# Patient Record
Sex: Male | Born: 1985 | Race: Black or African American | Hispanic: No | Marital: Single | Smoking: Current every day smoker
Health system: Southern US, Community
[De-identification: ages and names within clinical notes are randomized; demographics above are authoritative.]

## PROBLEM LIST (undated history)

## (undated) DIAGNOSIS — L0390 Cellulitis, unspecified: Secondary | ICD-10-CM

## (undated) DIAGNOSIS — Q820 Hereditary lymphedema: Secondary | ICD-10-CM

## (undated) DIAGNOSIS — I890 Lymphedema, not elsewhere classified: Secondary | ICD-10-CM

---

## 2020-05-29 ENCOUNTER — Encounter (HOSPITAL_BASED_OUTPATIENT_CLINIC_OR_DEPARTMENT_OTHER): Payer: Self-pay

## 2020-05-29 ENCOUNTER — Other Ambulatory Visit (HOSPITAL_BASED_OUTPATIENT_CLINIC_OR_DEPARTMENT_OTHER): Payer: Self-pay

## 2020-05-29 ENCOUNTER — Emergency Department (HOSPITAL_BASED_OUTPATIENT_CLINIC_OR_DEPARTMENT_OTHER)
Admission: EM | Admit: 2020-05-29 | Discharge: 2020-05-29 | Disposition: A | Discharge status: Home / Self Care | Payer: Self-pay | Attending: Emergency Medicine | Admitting: Emergency Medicine

## 2020-05-29 ENCOUNTER — Other Ambulatory Visit: Payer: Self-pay

## 2020-05-29 ENCOUNTER — Emergency Department (HOSPITAL_BASED_OUTPATIENT_CLINIC_OR_DEPARTMENT_OTHER): Payer: Self-pay

## 2020-05-29 DIAGNOSIS — R197 Diarrhea, unspecified: Secondary | ICD-10-CM

## 2020-05-29 DIAGNOSIS — I890 Lymphedema, not elsewhere classified: Secondary | ICD-10-CM

## 2020-05-29 DIAGNOSIS — F17210 Nicotine dependence, cigarettes, uncomplicated: Secondary | ICD-10-CM | POA: Insufficient documentation

## 2020-05-29 DIAGNOSIS — L03116 Cellulitis of left lower limb: Secondary | ICD-10-CM

## 2020-05-29 HISTORY — DX: Cellulitis, unspecified: L03.90

## 2020-05-29 MED ORDER — CEPHALEXIN 500 MG PO CAPS: 500.0000 mg | ORAL_CAPSULE | Freq: Four times a day (QID) | ORAL | 0 refills | Status: DC

## 2020-05-29 MED ORDER — LOPERAMIDE HCL 2 MG PO TABS
2.0000 mg | ORAL_TABLET | Freq: Four times a day (QID) | ORAL | 0 refills | Status: DC | PRN
  Filled 2020-05-29: qty 24, 6d supply, fill #0

## 2020-05-29 NOTE — Discharge Instructions (Signed)
Wear the compression stockings.  Watch for increasing signs of infection.  Try and keep yourself hydrated.  Follow-up with a primary care doctor for chronic management of the edema.

## 2020-05-29 NOTE — ED Triage Notes (Signed)
Pt presents with 3 day history of left lower leg swelling.  He states it does not really hurt.  He has tried elevating the leg without any relief.  He has not taken any medications for this issue.  He states he has also had diarrhea and estimates approximately 4 instances of diarrhea since yesterday.

## 2020-05-29 NOTE — ED Notes (Signed)
ED Provider at bedside. 

## 2020-05-29 NOTE — ED Provider Notes (Signed)
[facility] EMERGENCY DEPARTMENT Provider Note   CSN: [id number] Arrival date & time: 05/29/20  2440     History Chief Complaint  Patient presents with  . Leg Swelling    Omar Hayden is a 35 y.o. male.  HPI Patient presents with leg swelling.  Has acute on chronic lymphedema.  States his family has had and he has it too.  Usually wears pressure dressings.  States he thinks it is more swollen because he did a heavy amount of drinking over the last weekend.  No fevers but states he has had some chills and some sweats last night.  Also developed some diarrhea.  There is more swelling of the left lower extremity than baseline.  There is also erythema.  No drainage.    Past Medical History:  Diagnosis Date  . Cellulitis     There are no problems to display for this patient.   History reviewed. No pertinent surgical history.     No family history on file.  Social History   Tobacco Use  . Smoking status: Current Every Day Smoker    Packs/day: 1.00    Types: Cigarettes  . Smokeless tobacco: Never Used  Vaping Use  . Vaping Use: Never used  Substance Use Topics  . Alcohol use: Yes    Comment: states drinks alcohol very rarely  . Drug use: Never    Home Medications Prior to Admission medications   Medication Sig Start Date End Date Taking? Authorizing Provider  cephALEXin (KEFLEX) 500 MG capsule Take 1 capsule (500 mg total) by mouth 4 (four) times daily. 05/29/20  Yes Benjiman Core, MD  loperamide (IMODIUM) 2 MG capsule Take 1 capsule (2 mg total) by mouth 4 (four) times daily as needed for diarrhea or loose stools. 05/29/20  Yes Benjiman Core, MD    Allergies    Patient has no known allergies.  Review of Systems   Review of Systems  Constitutional: Positive for chills and diaphoresis. Negative for appetite change and fatigue.  HENT: Negative for congestion.   Cardiovascular: Positive for leg swelling.  Gastrointestinal: Positive for diarrhea.  Negative for abdominal pain.  Genitourinary: Negative for enuresis.  Musculoskeletal: Negative for back pain.  Skin: Positive for color change.  Neurological: Negative for weakness.  Psychiatric/Behavioral: Negative for confusion.    Physical Exam Updated Vital Signs BP 128/81 (BP Location: Right Arm)   Pulse 76   Temp 98 F (36.7 C) (Oral)   Resp 16   Ht 6\' 2"  (1.88 m)   Wt 111.1 kg   SpO2 99%   BMI 31.46 kg/m   Physical Exam Vitals and nursing note reviewed.  Constitutional:      Appearance: He is not ill-appearing.  HENT:     Head: Atraumatic.  Eyes:     Pupils: Pupils are equal, round, and reactive to light.  Cardiovascular:     Rate and Rhythm: Regular rhythm.  Pulmonary:     Breath sounds: No wheezing.  Abdominal:     Tenderness: There is no abdominal tenderness.  Musculoskeletal:     Cervical back: Neck supple.     Comments: Lymphedema bilateral lower extremities.  Goes to release knees.  Worse on left compared to right.  There is edema down to the foot on the left more than the right.  There is erythema of the lower left side.  No fluctuance.  Some induration particularly posterior.  Skin:    General: Skin is warm.     Capillary  Refill: Capillary refill takes less than 2 seconds.  Neurological:     Mental Status: He is alert and oriented to person, place, and time.  Psychiatric:        Mood and Affect: Mood normal.     ED Results / Procedures / Treatments   Labs (all labs ordered are listed, but only abnormal results are displayed) Labs Reviewed - No data to display  EKG None  Radiology US Venous Img Lower Unilateral Left  Result Date: 05/29/2020 CLINICAL DATA:  Left leg swelling. EXAM: LEFT LOWER EXTREMITY VENOUS DOPPLER ULTRASOUND TECHNIQUE: Gray-scale sonography with compression, as well as color and duplex ultrasound, were performed to evaluate the deep venous system(s) from the level of the common femoral vein through the popliteal and proximal  calf veins. COMPARISON:  None. FINDINGS: VENOUS Normal compressibility of the common femoral, superficial femoral, and popliteal veins, as well as the visualized calf veins. Visualized portions of profunda femoral vein and great saphenous vein unremarkable. No filling defects to suggest DVT on grayscale or color Doppler imaging. Doppler waveforms show normal direction of venous flow, normal respiratory plasticity and response to augmentation. Limited views of the contralateral common femoral vein are unremarkable. IMPRESSION: No evidence of DVT. Electronically Signed   By: Feliberto Harts MD   On: 05/29/2020 09:52    Procedures Procedures   Medications Ordered in ED Medications - No data to display  ED Course  I have reviewed the triage vital signs and the nursing notes.  Pertinent labs & imaging results that were available during my care of the patient were reviewed by me and considered in my medical decision making (see chart for details).    MDM Rules/Calculators/A&P                          Patient with lymphedema of both lower extremities worse on left.  Normally is worse on left but not usually not this swollen.  Doppler done and negative.  Reviewed lab work from 2 days ago at [facility].  Reassuring.  However with increasing redness and swelling we will treat for cellulitis.  Doubt abscess.  Will give antibiotics.  Outpatient follow-up as needed.  Also has had diarrhea.  Will give symptomatic treatment.  Otherwise well-appearing. Final Clinical Impression(s) / ED Diagnoses Final diagnoses:  Cellulitis of left lower extremity  Diarrhea, unspecified type  Lymphedema    Rx / DC Orders ED Discharge Orders         Ordered    cephALEXin (KEFLEX) 500 MG capsule  4 times daily        05/29/20 1015    loperamide (IMODIUM) 2 MG capsule  4 times daily PRN        05/29/20 1015           Benjiman Core, MD 05/29/20 1022

## 2020-06-05 ENCOUNTER — Other Ambulatory Visit (HOSPITAL_BASED_OUTPATIENT_CLINIC_OR_DEPARTMENT_OTHER): Payer: Self-pay

## 2020-11-04 ENCOUNTER — Other Ambulatory Visit: Payer: Self-pay

## 2020-11-04 ENCOUNTER — Encounter (HOSPITAL_BASED_OUTPATIENT_CLINIC_OR_DEPARTMENT_OTHER): Payer: Self-pay | Admitting: Emergency Medicine

## 2020-11-04 ENCOUNTER — Emergency Department (HOSPITAL_BASED_OUTPATIENT_CLINIC_OR_DEPARTMENT_OTHER)
Admission: EM | Admit: 2020-11-04 | Discharge: 2020-11-05 | Disposition: A | Discharge status: Home / Self Care | Payer: Self-pay | Attending: Emergency Medicine | Admitting: Emergency Medicine

## 2020-11-04 DIAGNOSIS — F17210 Nicotine dependence, cigarettes, uncomplicated: Secondary | ICD-10-CM | POA: Insufficient documentation

## 2020-11-04 DIAGNOSIS — L03116 Cellulitis of left lower limb: Secondary | ICD-10-CM | POA: Insufficient documentation

## 2020-11-04 HISTORY — DX: Hereditary lymphedema: Q82.0

## 2020-11-04 NOTE — ED Triage Notes (Signed)
Pt reports left leg pain since Friday with swelling and redness

## 2020-11-04 NOTE — ED Provider Notes (Signed)
[facility] Provider Note: Omar Dell, MD, FACEP  CSN: [id number] MRN: [id number] ARRIVAL: 11/04/20 at 2051 ROOM: MH01/[facility]01   CHIEF COMPLAINT  Leg Pain   HISTORY OF PRESENT ILLNESS  11/04/20 11:58 PM Omar Hayden is a 35 y.o. 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  He is here with about 3 days of pain and erythema to his posterior left thigh.  He rates his pain as an 8 out of 10, throbbing in nature.  It is worse with attempted ambulation.  He has had a fever with this although he does not know how high.  He denies pain in the lymphedematous parts of his legs.   Past Medical History:  Diagnosis Date   Cellulitis    Hereditary lymphedema of legs     History reviewed. No pertinent surgical history.  History reviewed. No pertinent family history.  Social History   Tobacco Use   Smoking status: Every Day    Packs/day: 1.00    Types: Cigarettes   Smokeless tobacco: Never  Vaping Use   Vaping Use: Never used  Substance Use Topics   Alcohol use: Yes    Comment: states drinks alcohol very rarely   Drug use: Never    Prior to Admission medications   Medication Sig Start Date End Date Taking? Authorizing Provider  cephALEXin (KEFLEX) 500 MG capsule Take 1 capsule (500 mg total) by mouth 4 (four) times daily. 11/05/20   Cheick Suhr, Jonny Ruiz, MD    Allergies Patient has no known allergies.   REVIEW OF SYSTEMS  Negative except as noted here or in the History of Present Illness.   PHYSICAL EXAMINATION  Initial Vital Signs Blood pressure (!) 145/84, pulse (!) 107, temperature 98.2 F (36.8 C), temperature source Oral, resp. rate 18, height 6\' 2"  (1.88 m), weight 108.9 kg, SpO2 98 %.  Examination General: Well-developed, well-nourished male in no acute distress; appearance consistent with age of record HENT: normocephalic; atraumatic Eyes: Normal appearance Neck: supple Heart: regular rate and rhythm Lungs: clear to auscultation bilaterally Abdomen:  soft; nondistended; nontender; bowel sounds present Extremities: Chronic appearing lymphedema of the lower legs with erythema, tenderness and warmth of the left posterior thigh:      Neurologic: Awake, alert and oriented; motor function intact in all extremities and symmetric; no facial droop Skin: Warm and dry Psychiatric: Normal mood and affect   RESULTS  Summary of this visit's results, reviewed and interpreted by myself:   EKG Interpretation  Date/Time:    Ventricular Rate:    PR Interval:    QRS Duration:   QT Interval:    QTC Calculation:   R Axis:     Text Interpretation:         Laboratory Studies: No results found for this or any previous visit (from the past 24 hour(s)). Imaging Studies: No results found.  ED COURSE and MDM  Nursing notes, initial and subsequent vitals signs, including pulse oximetry, reviewed and interpreted by myself.  Vitals:   11/04/20 2058 11/04/20 2059  BP: (!) 145/84   Pulse: (!) 107   Resp: 18   Temp: 98.2 F (36.8 C)   TempSrc: Oral   SpO2: 98%   Weight:  108.9 kg  Height:  6\' 2"  (1.88 m)   Medications  cephALEXin (KEFLEX) capsule 1,000 mg (has no administration in time range)    Clinical presentation is consistent with cellulitis.  The edema of his legs is chronic and acute DVT would not present like this.  He has been treated for cellulitis associated with his lower extremities in the past.  PROCEDURES  Procedures   ED DIAGNOSES     ICD-10-CM   1. Cellulitis of left lower extremity  L03.116          Treanna Dumler, MD 11/05/20 0010

## 2020-11-05 ENCOUNTER — Encounter (HOSPITAL_BASED_OUTPATIENT_CLINIC_OR_DEPARTMENT_OTHER): Payer: Self-pay | Admitting: Emergency Medicine

## 2020-11-05 ENCOUNTER — Other Ambulatory Visit (HOSPITAL_BASED_OUTPATIENT_CLINIC_OR_DEPARTMENT_OTHER): Payer: Self-pay

## 2020-11-05 MED ORDER — HYDROCODONE-ACETAMINOPHEN 5-325 MG PO TABS
1.0000 | ORAL_TABLET | Freq: Once | ORAL | Status: AC
  Administered 2020-11-05: 1 via ORAL
  Filled 2020-11-05: qty 1

## 2020-11-05 MED ORDER — CEPHALEXIN 500 MG PO CAPS: 500.0000 mg | ORAL_CAPSULE | Freq: Four times a day (QID) | ORAL | 1 refills | Status: DC

## 2020-11-05 MED ORDER — CEPHALEXIN 250 MG PO CAPS
1000.0000 mg | ORAL_CAPSULE | Freq: Once | ORAL | Status: AC
  Administered 2020-11-05: 1000 mg via ORAL
  Filled 2020-11-05: qty 4

## 2020-11-05 MED ORDER — HYDROCODONE-ACETAMINOPHEN 5-325 MG PO TABS
1.0000 | ORAL_TABLET | Freq: Four times a day (QID) | ORAL | 0 refills | Status: DC | PRN
  Filled 2020-11-05: qty 12, 3d supply, fill #0

## 2020-11-05 NOTE — ED Notes (Signed)
Pt verbalized understanding of using crutches and states he has used them before

## 2020-11-22 ENCOUNTER — Encounter (HOSPITAL_BASED_OUTPATIENT_CLINIC_OR_DEPARTMENT_OTHER): Payer: Self-pay

## 2020-11-22 ENCOUNTER — Emergency Department (HOSPITAL_BASED_OUTPATIENT_CLINIC_OR_DEPARTMENT_OTHER)
Admission: EM | Admit: 2020-11-22 | Discharge: 2020-11-22 | Disposition: A | Discharge status: Home / Self Care | Payer: Self-pay | Attending: Emergency Medicine | Admitting: Emergency Medicine

## 2020-11-22 ENCOUNTER — Other Ambulatory Visit: Payer: Self-pay

## 2020-11-22 DIAGNOSIS — R600 Localized edema: Secondary | ICD-10-CM | POA: Insufficient documentation

## 2020-11-22 DIAGNOSIS — F17210 Nicotine dependence, cigarettes, uncomplicated: Secondary | ICD-10-CM | POA: Insufficient documentation

## 2020-11-22 DIAGNOSIS — Z202 Contact with and (suspected) exposure to infections with a predominantly sexual mode of transmission: Secondary | ICD-10-CM | POA: Insufficient documentation

## 2020-11-22 MED ORDER — CEFTRIAXONE SODIUM 500 MG IJ SOLR
500.0000 mg | Freq: Once | INTRAMUSCULAR | Status: AC
  Administered 2020-11-22: 500 mg via INTRAMUSCULAR
  Filled 2020-11-22: qty 500

## 2020-11-22 MED ORDER — AZITHROMYCIN 250 MG PO TABS
1000.0000 mg | ORAL_TABLET | Freq: Once | ORAL | Status: AC
  Administered 2020-11-22: 1000 mg via ORAL
  Filled 2020-11-22: qty 4

## 2020-11-22 MED ORDER — LIDOCAINE HCL (PF) 1 % IJ SOLN
1.0000 mL | Freq: Once | INTRAMUSCULAR | Status: AC
  Administered 2020-11-22: 1 mL
  Filled 2020-11-22: qty 5

## 2020-11-22 NOTE — ED Notes (Signed)
No acute distress noted upon this RN's departure of patient. Verified discharge paperwork with name and DOB. Vital signs stable. Patient taken to checkout window. Discharge paperwork discussed with patient. No further questions voiced upon discharge.  ° °

## 2020-11-22 NOTE — ED Triage Notes (Signed)
First contact with patient. Patient arrived via triage from home with complaints of STI contact - Patient states his girlfriend was diagnosed with chlamydia. Patient states he has a genetic condition causing bilateral leg swelling. Patient states his swelling has been worse since Tuesday - he believes its from the STI. Pt is A&OX 4. Respirations even/unlabored. Patient placed on monitor and call light within reach. Patient updated on plan of care. Will continue to monitor patient.

## 2020-11-22 NOTE — ED Provider Notes (Signed)
[place] EMERGENCY DEPARTMENT Provider Note   CSN: [id number] Arrival date & time: 11/22/20  [id number]     History Chief Complaint  Patient presents with   SEXUALLY TRANSMITTED DISEASE   Leg Swelling    Omar Hayden is a 35 y.o. male.  Patient's sexual partner reported to him that they were positive for chlamydia.  Last encounter was about 2 weeks ago.  Patient denies any discharge.  Denies any pain in the groin area.  Patient has hereditary lymphedema and a longstanding leg edema.  He did not know whether STD exposure would make that worse.  He states the swelling in the legs is not any worse.  But there is some discomfort in the thigh area.  And that is bilateral.      Past Medical History:  Diagnosis Date   Cellulitis    Hereditary lymphedema of legs     There are no problems to display for this patient.   History reviewed. No pertinent surgical history.     History reviewed. No pertinent family history.  Social History   Tobacco Use   Smoking status: Every Day    Packs/day: 0.50    Types: Cigarettes   Smokeless tobacco: Never  Vaping Use   Vaping Use: Never used  Substance Use Topics   Alcohol use: Yes    Comment: states drinks alcohol very rarely   Drug use: Never    Home Medications Prior to Admission medications   Medication Sig Start Date End Date Taking? Authorizing Provider  cephALEXin (KEFLEX) 500 MG capsule Take 1 capsule (500 mg total) by mouth 4 (four) times daily. 11/05/20   Molpus, John, MD  HYDROcodone-acetaminophen (NORCO) 5-325 MG tablet Take 1 tablet by mouth every 6 (six) hours as needed for severe pain. 11/05/20   Molpus, Jenny Reichmann, MD    Allergies    Patient has no known allergies.  Review of Systems   Review of Systems  Constitutional:  Negative for chills and fever.  HENT:  Negative for ear pain and sore throat.   Eyes:  Negative for pain and visual disturbance.  Respiratory:  Negative for cough and shortness of breath.    Cardiovascular:  Positive for leg swelling. Negative for chest pain and palpitations.  Gastrointestinal:  Negative for abdominal pain and vomiting.  Genitourinary:  Negative for difficulty urinating, dysuria, genital sores, hematuria, penile pain, scrotal swelling and testicular pain.  Musculoskeletal:  Negative for arthralgias and back pain.  Skin:  Negative for color change and rash.  Neurological:  Negative for seizures and syncope.  All other systems reviewed and are negative.  Physical Exam Updated Vital Signs BP 133/88 (BP Location: Right Arm)   Pulse 78   Temp 97.7 F (36.5 C) (Oral)   Resp 16   Ht 1.88 m (6\' 2" )   Wt 108.9 kg   SpO2 99%   BMI 30.81 kg/m   Physical Exam Vitals and nursing note reviewed.  Constitutional:      General: He is not in acute distress.    Appearance: Normal appearance. He is well-developed.  HENT:     Head: Normocephalic and atraumatic.  Eyes:     Extraocular Movements: Extraocular movements intact.     Conjunctiva/sclera: Conjunctivae normal.     Pupils: Pupils are equal, round, and reactive to light.  Cardiovascular:     Rate and Rhythm: Normal rate and regular rhythm.     Heart sounds: No murmur heard. Pulmonary:     Effort: Pulmonary  effort is normal. No respiratory distress.     Breath sounds: Normal breath sounds.  Abdominal:     Palpations: Abdomen is soft.     Tenderness: There is no abdominal tenderness.  Genitourinary:    Penis: Normal.      Comments: Uncircumcised no discharge no lesions.  Testicles without any tenderness no scrotal swelling.  No groin adenopathy. Musculoskeletal:        General: Swelling present. Normal range of motion.     Cervical back: Normal range of motion and neck supple.     Right lower leg: Edema present.     Left lower leg: Edema present.  Skin:    General: Skin is warm and dry.     Capillary Refill: Capillary refill takes less than 2 seconds.  Neurological:     General: No focal deficit  present.     Mental Status: He is alert and oriented to person, place, and time.  Psychiatric:        Mood and Affect: Mood normal.    ED Results / Procedures / Treatments   Labs (all labs ordered are listed, but only abnormal results are displayed) Labs Reviewed  GC/CHLAMYDIA PROBE AMP ([place]) NOT AT [facility]    EKG None  Radiology No results found.  Procedures Procedures   Medications Ordered in ED Medications  cefTRIAXone (ROCEPHIN) injection 500 mg (500 mg Intramuscular Given 11/22/20 0957)  lidocaine (PF) (XYLOCAINE) 1 % injection 1 mL (1 mL Other Given 11/22/20 0956)  azithromycin (ZITHROMAX) tablet 1,000 mg (1,000 mg Oral Given 11/22/20 3500)    ED Course  I have reviewed the triage vital signs and the nursing notes.  Pertinent labs & imaging results that were available during my care of the patient were reviewed by me and considered in my medical decision making (see chart for details).    MDM Rules/Calculators/A&P                          Patient's general urinary exam without any significant abnormalities.  Patient does have marked bilateral lower extremity swelling but according patient baseline.  Will treat for STD exposure with Rocephin and Zithromax.  Patient prefers to have all the treatment here.  Patient without any outward symptoms even though last exposure was 2 weeks ago.    Final Clinical Impression(s) / ED Diagnoses Final diagnoses:  Exposure to sexually transmitted disease (STD)    Rx / DC Orders ED Discharge Orders     None        Vanetta Mulders, MD 11/23/20 [phone]

## 2020-11-22 NOTE — Discharge Instructions (Signed)
Return for any new or worse symptoms.  Make sure your sexual partner is treated as well.

## 2020-11-25 LAB — GC/CHLAMYDIA PROBE AMP (~~LOC~~) NOT AT ARMC
Chlamydia: NEGATIVE
Comment: NEGATIVE
Comment: NORMAL
Neisseria Gonorrhea: NEGATIVE

## 2021-03-28 ENCOUNTER — Inpatient Hospital Stay (HOSPITAL_BASED_OUTPATIENT_CLINIC_OR_DEPARTMENT_OTHER)
Admission: EM | Admit: 2021-03-28 | Discharge: 2021-03-31 | DRG: 872 | Disposition: A | Discharge status: Home / Self Care | Payer: 59 | Attending: Internal Medicine | Admitting: Internal Medicine

## 2021-03-28 ENCOUNTER — Other Ambulatory Visit: Payer: Self-pay

## 2021-03-28 ENCOUNTER — Encounter (HOSPITAL_BASED_OUTPATIENT_CLINIC_OR_DEPARTMENT_OTHER): Payer: Self-pay | Admitting: Emergency Medicine

## 2021-03-28 ENCOUNTER — Emergency Department (HOSPITAL_BASED_OUTPATIENT_CLINIC_OR_DEPARTMENT_OTHER): Payer: 59

## 2021-03-28 DIAGNOSIS — D72829 Elevated white blood cell count, unspecified: Secondary | ICD-10-CM

## 2021-03-28 DIAGNOSIS — L03116 Cellulitis of left lower limb: Secondary | ICD-10-CM | POA: Diagnosis present

## 2021-03-28 DIAGNOSIS — Z20822 Contact with and (suspected) exposure to covid-19: Secondary | ICD-10-CM | POA: Diagnosis present

## 2021-03-28 DIAGNOSIS — L0390 Cellulitis, unspecified: Secondary | ICD-10-CM | POA: Diagnosis not present

## 2021-03-28 DIAGNOSIS — E871 Hypo-osmolality and hyponatremia: Secondary | ICD-10-CM | POA: Diagnosis present

## 2021-03-28 DIAGNOSIS — A419 Sepsis, unspecified organism: Secondary | ICD-10-CM | POA: Diagnosis not present

## 2021-03-28 DIAGNOSIS — I890 Lymphedema, not elsewhere classified: Secondary | ICD-10-CM

## 2021-03-28 DIAGNOSIS — Z6834 Body mass index (BMI) 34.0-34.9, adult: Secondary | ICD-10-CM | POA: Diagnosis not present

## 2021-03-28 DIAGNOSIS — B372 Candidiasis of skin and nail: Secondary | ICD-10-CM | POA: Diagnosis present

## 2021-03-28 DIAGNOSIS — F17210 Nicotine dependence, cigarettes, uncomplicated: Secondary | ICD-10-CM | POA: Diagnosis present

## 2021-03-28 DIAGNOSIS — E876 Hypokalemia: Secondary | ICD-10-CM | POA: Diagnosis present

## 2021-03-28 DIAGNOSIS — E669 Obesity, unspecified: Secondary | ICD-10-CM | POA: Diagnosis present

## 2021-03-28 DIAGNOSIS — L03115 Cellulitis of right lower limb: Secondary | ICD-10-CM | POA: Diagnosis present

## 2021-03-28 DIAGNOSIS — L304 Erythema intertrigo: Secondary | ICD-10-CM | POA: Diagnosis present

## 2021-03-28 HISTORY — DX: Lymphedema, not elsewhere classified: I89.0

## 2021-03-28 LAB — CBC WITH DIFFERENTIAL/PLATELET
Abs Immature Granulocytes: 0.14 10*3/uL — ABNORMAL HIGH (ref 0.00–0.07)
Basophils Absolute: 0 10*3/uL (ref 0.0–0.1)
Basophils Relative: 0 %
Eosinophils Absolute: 0 10*3/uL (ref 0.0–0.5)
Eosinophils Relative: 0 %
HCT: 43.7 % (ref 39.0–52.0)
Hemoglobin: 14.8 g/dL (ref 13.0–17.0)
Immature Granulocytes: 1 %
Lymphocytes Relative: 5 %
Lymphs Abs: 0.9 10*3/uL (ref 0.7–4.0)
MCH: 28.8 pg (ref 26.0–34.0)
MCHC: 33.9 g/dL (ref 30.0–36.0)
MCV: 85.2 fL (ref 80.0–100.0)
Monocytes Absolute: 1.2 10*3/uL — ABNORMAL HIGH (ref 0.1–1.0)
Monocytes Relative: 7 %
Neutro Abs: 14.7 10*3/uL — ABNORMAL HIGH (ref 1.7–7.7)
Neutrophils Relative %: 87 %
Platelets: 282 10*3/uL (ref 150–400)
RBC: 5.13 MIL/uL (ref 4.22–5.81)
RDW: 13.2 % (ref 11.5–15.5)
WBC: 17 10*3/uL — ABNORMAL HIGH (ref 4.0–10.5)
nRBC: 0 % (ref 0.0–0.2)

## 2021-03-28 LAB — COMPREHENSIVE METABOLIC PANEL
ALT: 30 U/L (ref 0–44)
AST: 21 U/L (ref 15–41)
Albumin: 3.5 g/dL (ref 3.5–5.0)
Alkaline Phosphatase: 82 U/L (ref 38–126)
Anion gap: 10 (ref 5–15)
BUN: 13 mg/dL (ref 6–20)
CO2: 24 mmol/L (ref 22–32)
Calcium: 8.8 mg/dL — ABNORMAL LOW (ref 8.9–10.3)
Chloride: 98 mmol/L (ref 98–111)
Creatinine, Ser: 1.2 mg/dL (ref 0.61–1.24)
GFR, Estimated: >60 mL/min (ref >60)
Glucose, Bld: 110 mg/dL — ABNORMAL HIGH (ref 70–99)
Potassium: 3.2 mmol/L — ABNORMAL LOW (ref 3.5–5.1)
Sodium: 132 mmol/L — ABNORMAL LOW (ref 135–145)
Total Bilirubin: 1.2 mg/dL (ref 0.3–1.2)
Total Protein: 8.3 g/dL — ABNORMAL HIGH (ref 6.5–8.1)

## 2021-03-28 LAB — RESP PANEL BY RT-PCR (FLU A&B, COVID) ARPGX2
Influenza A by PCR: NEGATIVE
Influenza B by PCR: NEGATIVE
SARS Coronavirus 2 by RT PCR: NEGATIVE

## 2021-03-28 LAB — PROTIME-INR
INR: 1.3 — ABNORMAL HIGH (ref 0.8–1.2)
Prothrombin Time: 16.4 seconds — ABNORMAL HIGH (ref 11.4–15.2)

## 2021-03-28 LAB — LIPASE, BLOOD: Lipase: 24 U/L (ref 11–51)

## 2021-03-28 LAB — PHOSPHORUS: Phosphorus: 2.3 mg/dL — ABNORMAL LOW (ref 2.5–4.6)

## 2021-03-28 LAB — MAGNESIUM: Magnesium: 1.7 mg/dL (ref 1.7–2.4)

## 2021-03-28 LAB — BRAIN NATRIURETIC PEPTIDE: B Natriuretic Peptide: 42.5 pg/mL (ref 0.0–100.0)

## 2021-03-28 LAB — APTT: aPTT: 31 seconds (ref 24–36)

## 2021-03-28 LAB — LACTIC ACID, PLASMA: Lactic Acid, Venous: 1.2 mmol/L (ref 0.5–1.9)

## 2021-03-28 MED ORDER — MORPHINE SULFATE (PF) 4 MG/ML IV SOLN
4.0000 mg | INTRAVENOUS | Status: DC | PRN
  Administered 2021-03-28: 4 mg via INTRAVENOUS
  Filled 2021-03-28: qty 1

## 2021-03-28 MED ORDER — HYDROMORPHONE HCL 1 MG/ML IJ SOLN: 1.0000 mg | INTRAMUSCULAR | Status: DC | PRN

## 2021-03-28 MED ORDER — LACTATED RINGERS IV BOLUS
1000.0000 mL | Freq: Once | INTRAVENOUS | Status: AC
  Administered 2021-03-28: 1000 mL via INTRAVENOUS

## 2021-03-28 MED ORDER — POTASSIUM CHLORIDE CRYS ER 20 MEQ PO TBCR
40.0000 meq | EXTENDED_RELEASE_TABLET | Freq: Two times a day (BID) | ORAL | Status: AC
  Administered 2021-03-28 – 2021-03-29 (×2): 40 meq via ORAL
  Filled 2021-03-28 (×2): qty 2

## 2021-03-28 MED ORDER — ENOXAPARIN SODIUM 40 MG/0.4ML IJ SOSY: 40.0000 mg | PREFILLED_SYRINGE | INTRAMUSCULAR | Status: DC

## 2021-03-28 MED ORDER — ENOXAPARIN SODIUM 60 MG/0.6ML IJ SOSY
60.0000 mg | PREFILLED_SYRINGE | INTRAMUSCULAR | Status: DC
  Administered 2021-03-28 – 2021-03-30 (×3): 60 mg via SUBCUTANEOUS
  Filled 2021-03-28 (×3): qty 0.6

## 2021-03-28 MED ORDER — VANCOMYCIN HCL 1250 MG/250ML IV SOLN
1250.0000 mg | Freq: Two times a day (BID) | INTRAVENOUS | Status: DC
  Administered 2021-03-29 – 2021-03-31 (×5): 1250 mg via INTRAVENOUS
  Filled 2021-03-28 (×7): qty 250

## 2021-03-28 MED ORDER — KETOROLAC TROMETHAMINE 15 MG/ML IJ SOLN
15.0000 mg | Freq: Once | INTRAMUSCULAR | Status: AC
  Administered 2021-03-28: 15 mg via INTRAVENOUS
  Filled 2021-03-28: qty 1

## 2021-03-28 MED ORDER — POTASSIUM CHLORIDE CRYS ER 20 MEQ PO TBCR
40.0000 meq | EXTENDED_RELEASE_TABLET | ORAL | Status: AC
  Administered 2021-03-28 (×2): 40 meq via ORAL
  Filled 2021-03-28 (×2): qty 2

## 2021-03-28 MED ORDER — LACTATED RINGERS IV SOLN: INTRAVENOUS | Status: DC

## 2021-03-28 MED ORDER — FLUCONAZOLE 100MG IVPB
100.0000 mg | INTRAVENOUS | Status: DC
Start: 2021-03-29 — End: 2021-03-31
  Administered 2021-03-29 – 2021-03-30 (×2): 100 mg via INTRAVENOUS
  Filled 2021-03-28 (×6): qty 50

## 2021-03-28 MED ORDER — FLUCONAZOLE IN SODIUM CHLORIDE 200-0.9 MG/100ML-% IV SOLN
200.0000 mg | Freq: Once | INTRAVENOUS | Status: AC
  Administered 2021-03-28: 200 mg via INTRAVENOUS
  Filled 2021-03-28: qty 100

## 2021-03-28 MED ORDER — VANCOMYCIN HCL IN DEXTROSE 1-5 GM/200ML-% IV SOLN
1000.0000 mg | Freq: Once | INTRAVENOUS | Status: AC
  Administered 2021-03-28: 1000 mg via INTRAVENOUS
  Filled 2021-03-28: qty 200

## 2021-03-28 MED ORDER — HYDROCODONE-ACETAMINOPHEN 5-325 MG PO TABS
1.0000 | ORAL_TABLET | Freq: Four times a day (QID) | ORAL | Status: DC | PRN
  Administered 2021-03-28 – 2021-03-30 (×5): 1 via ORAL
  Filled 2021-03-28 (×5): qty 1

## 2021-03-28 NOTE — Progress Notes (Signed)
Plan of Care Note for accepted transfer ? ? ?Patient: Omar Hayden MRN: [id number]   DOA: 03/28/2021 ? ?Facility requesting transfer: [facility].  ?Requesting Provider: Wynona Dove, DO. ?Reason for transfer: Sepsis due to RLE cellulitis. ?Facility course:  ?36 year old male with a past medical history of lymphedema, lower extremity cellulitis who came into the emergency department with complaints of fever, increased edema and tenderness on his RLE.  He met sepsis criteria with WBC, tachycardia and source of infection.  He received Toradol 15 mg IVP x1 and vancomycin. Blood pressures soft but IVF being infused. The patient is otherwise clinically stable.   ? ?Plan of care: ?The patient is accepted for admission to Telemetry unit, at [facility]..  ? ?Author: ?Reubin Milan, MD ?03/28/2021 ? ?Check [email or web address] for on-call coverage. ? ?Nursing staff, Please call [place] number on Amion as soon as patient's arrival, so appropriate admitting provider can evaluate the pt. ?

## 2021-03-28 NOTE — H&P (Signed)
? ? ?History and Physical ? ?Omar Hayden:[id number] DOB: [birth year] DOA: 03/28/2021 ? ?PCP: Pcp, No ?Patient coming from: Home ? ?I have personally briefly reviewed patient's old medical records in [facility] ? ? ?Chief Complaint: Left lower extremity pain ? ?HPI: Omar Hayden is a 36 y.o. male past medical history of lymphedema who presents with right lower extremity pain and swelling started 2 days prior to admission he relates some fever and chills some nausea and diarrhea, the erythema has progressed proximally denies any sick contacts. ? ?In the ED: ?Found to be tachycardic with a systolic blood pressure in the 90s was fluid resuscitated, 17,000 and afebrile ? ? ?Review of Systems: All systems reviewed and apart from history of presenting illness, are negative. ? ?Past Medical History:  ?Diagnosis Date  ? Cellulitis   ? Hereditary lymphedema of legs   ? Lymphedema of both lower extremities   ? ?History reviewed. No pertinent surgical history. ?Social History:  reports that he has been smoking cigarettes. He has been smoking an average of .5 packs per day. He has never used smokeless tobacco. He reports current alcohol use. He reports that he does not use drugs. ? ? ?No Known Allergies ? ?Family History  ?Problem Relation Age of Onset  ? Lung cancer Father   ?  ? ?Prior to Admission medications   ?Medication Sig Start Date End Date Taking? Authorizing Provider  ?cephALEXin (KEFLEX) 500 MG capsule Take 1 capsule (500 mg total) by mouth 4 (four) times daily. 11/05/20   Molpus, John, MD  ?HYDROcodone-acetaminophen (NORCO) 5-325 MG tablet Take 1 tablet by mouth every 6 (six) hours as needed for severe pain. 11/05/20   Molpus, Jonny Ruiz, MD  ? ?Physical Exam: ?Vitals:  ? 03/28/21 1030 03/28/21 1200 03/28/21 1300 03/28/21 1529  ?BP: (!) 98/58 (!) 114/59 102/64 108/74  ?Pulse: (!) 106  (!) 102 (!) 104  ?Resp: 20 (!) 25 (!) 24 20  ?Temp:    98.8 ?F (37.1 ?C)  ?TempSrc:      ?SpO2: 97%  98% 98%  ?Weight:      ?Height:       ? ? ?General exam: Moderately built and nourished patient, lying comfortably supine on the gurney in no obvious distress. ?Head, eyes and ENT: Nontraumatic and normocephalic. Pupils equally reacting to light and accommodation. Oral mucosa moist. ?Neck: Supple. No JVD, carotid bruit or thyromegaly. ?Lymphatics: No lymphadenopathy. ?Respiratory system: Clear to auscultation. No increased work of breathing. ?Cardiovascular system: S1 and S2 heard, RRR. No JVD. ?Gastrointestinal system: Abdomen is nondistended, soft and nontender.  ?Extremities: Bilateral lower extremity lymphedema the right lower extremity is more swollen erythematous warm to touch and tender to palpation he also has Candida intertrigo ?Skin: No rashes or acute findings. ?Musculoskeletal system: Negative exam. ?Psychiatry: Pleasant and cooperative. ? ? ?Labs on Admission:  ?Basic Metabolic Panel: ?Recent Labs  ?Lab 03/28/21 ?0958 03/28/21 ?1141  ?NA 132*  --   ?K 3.2*  --   ?CL 98  --   ?CO2 24  --   ?GLUCOSE 110*  --   ?BUN 13  --   ?CREATININE 1.20  --   ?CALCIUM 8.8*  --   ?MG  --  1.7  ?PHOS  --  2.3*  ? ?Liver Function Tests: ?Recent Labs  ?Lab 03/28/21 ?0958  ?AST 21  ?ALT 30  ?ALKPHOS 82  ?BILITOT 1.2  ?PROT 8.3*  ?ALBUMIN 3.5  ? ?Recent Labs  ?Lab 03/28/21 ?0958  ?LIPASE 24  ? ?  No results for input(s): AMMONIA in the last 168 hours. ?CBC: ?Recent Labs  ?Lab 03/28/21 ?0958  ?WBC 17.0*  ?NEUTROABS 14.7*  ?HGB 14.8  ?HCT 43.7  ?MCV 85.2  ?PLT 282  ? ?Cardiac Enzymes: ?No results for input(s): CKTOTAL, CKMB, CKMBINDEX, TROPONINI in the last 168 hours. ? ?BNP (last 3 results) ?No results for input(s): PROBNP in the last 8760 hours. ?CBG: ?No results for input(s): GLUCAP in the last 168 hours. ? ?Radiological Exams on Admission: ?US Venous Img Lower Unilateral Right ? ?Result Date: 03/28/2021 ?CLINICAL DATA:  Right lower extremity pain and edema for the past 3 days. History of lymphedema. Evaluate for DVT. EXAM: RIGHT LOWER EXTREMITY VENOUS DOPPLER  ULTRASOUND TECHNIQUE: Gray-scale sonography with graded compression, as well as color Doppler and duplex ultrasound were performed to evaluate the lower extremity deep venous systems from the level of the common femoral vein and including the common femoral, femoral, profunda femoral, popliteal and calf veins including the posterior tibial, peroneal and gastrocnemius veins when visible. The superficial great saphenous vein was also interrogated. Spectral Doppler was utilized to evaluate flow at rest and with distal augmentation maneuvers in the common femoral, femoral and popliteal veins. COMPARISON:  Left lower extremity venous Doppler ultrasound-05/29/2020 (negative). FINDINGS: Examination is degraded due to patient body habitus and poor sonographic window. Contralateral Common Femoral Vein: Respiratory phasicity is normal and symmetric with the symptomatic side. No evidence of thrombus. Normal compressibility. Common Femoral Vein: No evidence of thrombus. Normal compressibility, respiratory phasicity and response to augmentation. Saphenofemoral Junction: No evidence of thrombus. Normal compressibility and flow on color Doppler imaging. Profunda Femoral Vein: No evidence of thrombus. Normal compressibility and flow on color Doppler imaging. Femoral Vein: No evidence of thrombus. Normal compressibility, respiratory phasicity and response to augmentation. Popliteal Vein: No evidence of thrombus. Normal compressibility, respiratory phasicity and response to augmentation. Calf Veins: Not well visualized Superficial Great Saphenous Vein: No evidence of thrombus. Normal compressibility. Venous Reflux:  None. Other Findings: Note is made of mildly prominent though non pathologically enlarged inguinal lymph nodes with index right inguinal lymph node measuring 1.2 cm in greatest short axis diameter (image 21), presumably reactive in etiology. IMPRESSION: No evidence of DVT within the right lower extremity. Electronically  Signed   By: Simonne Come M.D.   On: 03/28/2021 11:14   ? ?EKG: Independently reviewed.  Sinus tachycardia normal axis no T wave abnormalities ? ?Assessment/Plan ?Sepsis due to cellulitis (HCC)/ Possible Candida intertrigo: ?Agree with fluid resuscitation we will start empirically on IV vancomycin as he also has Candida intertrigo we will use IV Diflucan. ?Blood cultures have already been ordered in the ED. ?We will monitor closely his blood pressure and heart rate. ?Use Tylenol for fevers. ?He relates he was taking ibuprofen at home and Norco with no improvement. ?We will go ahead and give him IV morphine for the next 24 hours. ?I advised him to keep his leg elevated above his heart level. ? ?Lymphedema of both lower extremities ?Noted will need to follow-up with the outpatient lymphedema clinic ?Follow up with anyone for his lymphedema ?  ?DVT Prophylaxis: lovenox ?Code Status: Full ?Family Communication: none  ?Disposition Plan: inpatient  ? ?Time spent: 75 min ? ? ? ?It is my clinical opinion that admission to INPATIENT is reasonable and necessary in this 36 y.o. male who comes in with left lower extremity cellulitis in the setting of lymphedema was found to be septic fluid resuscitated started on IV empiric antibiotics ? ? ?Given  the aforementioned, the predictability of an adverse outcome is felt to be significant. I expect that the patient will require at least 2 midnights in the hospital to treat this condition. ? ?Marinda ElkAbraham Feliz Ortiz MD ?[facility] ? ? ?03/28/2021, 5:04 PM  ? ? ?

## 2021-03-28 NOTE — ED Triage Notes (Signed)
R foot pain and swelling since Wednesday. States he has a hx of lymphedema.  ?

## 2021-03-28 NOTE — Progress Notes (Signed)
[facility] is following code sepsis 

## 2021-03-28 NOTE — ED Notes (Signed)
Report provided to [facility] for transport. 

## 2021-03-28 NOTE — Progress Notes (Signed)
Pharmacy Antibiotic Note ? ?Omar Hayden is a 36 y.o. male admitted on 03/28/2021 with cellulitis.  Pharmacy has been consulted for vancomycin dosing. Pt is afebrile but WBC is elevated at 17. Scr and lactic acid are WNL.  ? ?Plan: ?Vancomycin 2gm IV x 1 then 1250mg  IV Q12H  ?F/u renal fxn, C&S, clinical status and peak/trough at [facility] ? ?Height: 6\' 2"  (188 cm) ?Weight: 121.1 kg (267 lb) ?IBW/kg (Calculated) : 82.2 ? ?Temp (24hrs), Avg:98.3 ?F (36.8 ?C), Min:98.3 ?F (36.8 ?C), Max:98.3 ?F (36.8 ?C) ? ?Recent Labs  ?Lab 03/28/21 ?0958  ?WBC 17.0*  ?CREATININE 1.20  ?LATICACIDVEN 1.2  ?  ?Estimated Creatinine Clearance: 118.9 mL/min (by C-G formula based on SCr of 1.2 mg/dL).   ? ?No Known Allergies ? ?Antimicrobials this admission: ?Vanc 3/24>>  ? ?Dose adjustments this admission: ?N/A ? ?Microbiology results: ?Pending ? ?Thank you for allowing pharmacy to be a part of this patient?s care. ? ?Onyx Edgley, ?03/28/2021 11:16 AM ? ?

## 2021-03-28 NOTE — ED Notes (Signed)
Patient transported to us 

## 2021-03-28 NOTE — ED Provider Notes (Signed)
?[place] EMERGENCY DEPARTMENT ?Provider Note ? ? ?CSN: [id number] ?Arrival date & time: 03/28/21  0845 ? ?  ? ?History ? ?Chief Complaint  ?Patient presents with  ? Foot Pain  ? ? ?Omar Hayden is a 36 y.o. male. ? ?This is a 36 y.o. male with significant medical history as below, including lymphedema who presents to the ED with complaint of right lower extremity pain and swelling.  Symptom onset Wednesday.  Subjective fevers and chills, nausea, vomiting, diarrhea, malaise.  Pain and swelling to right lower extremity.  Redness has progressively worsened up his leg.  Difficulty ambulating secondary to discomfort.  No medications prior to arrival.  No sick contacts or recent travel.  No history of DVT or PE.  No recent surgical procedures or overnight hospitalization.  No suspicious oral intake. ? ? ? ? ? ?Past Medical History: ?No date: Cellulitis ?No date: Hereditary lymphedema of legs ? ?History reviewed. No pertinent surgical history.  ? ? ?The history is provided by the patient. No language interpreter was used.  ?Foot Pain ?Associated symptoms include abdominal pain. Pertinent negatives include no chest pain, no headaches and no shortness of breath.  ? ?  ? ?Home Medications ?Prior to Admission medications   ?Medication Sig Start Date End Date Taking? Authorizing Provider  ?cephALEXin (KEFLEX) 500 MG capsule Take 1 capsule (500 mg total) by mouth 4 (four) times daily. 11/05/20   Molpus, John, MD  ?HYDROcodone-acetaminophen (NORCO) 5-325 MG tablet Take 1 tablet by mouth every 6 (six) hours as needed for severe pain. 11/05/20   Molpus, Jenny Reichmann, MD  ?   ? ?Allergies    ?Patient has no known allergies.   ? ?Review of Systems   ?Review of Systems  ?Constitutional:  Positive for chills, fatigue and fever.  ?HENT:  Negative for facial swelling and trouble swallowing.   ?Eyes:  Negative for photophobia and visual disturbance.  ?Respiratory:  Negative for cough and shortness of breath.   ?Cardiovascular:  Positive  for leg swelling. Negative for chest pain and palpitations.  ?Gastrointestinal:  Positive for abdominal pain, diarrhea, nausea and vomiting.  ?Endocrine: Negative for polydipsia and polyuria.  ?Genitourinary:  Negative for difficulty urinating and hematuria.  ?Musculoskeletal:  Negative for gait problem and joint swelling.  ?Skin:  Positive for color change. Negative for pallor and rash.  ?Neurological:  Negative for syncope and headaches.  ?Psychiatric/Behavioral:  Negative for agitation and confusion.   ? ?Physical Exam ?Updated Vital Signs ?BP 108/74 (BP Location: Right Arm)   Pulse (!) 104   Temp 98.8 ?F (37.1 ?C)   Resp 20   Ht 6\' 2"  (1.88 m)   Wt 121.1 kg   SpO2 98%   BMI 34.28 kg/m?  ?Physical Exam ?Vitals and nursing note reviewed.  ?Constitutional:   ?   General: He is not in acute distress. ?   Appearance: Normal appearance. He is well-developed.  ?HENT:  ?   Head: Normocephalic and atraumatic.  ?   Right Ear: External ear normal.  ?   Left Ear: External ear normal.  ?   Mouth/Throat:  ?   Mouth: Mucous membranes are moist.  ?Eyes:  ?   General: No scleral icterus. ?Cardiovascular:  ?   Rate and Rhythm: Normal rate and regular rhythm.  ?   Pulses: Normal pulses.  ?   Heart sounds: Normal heart sounds.  ?Pulmonary:  ?   Effort: Pulmonary effort is normal. No respiratory distress.  ?   Breath sounds:  Normal breath sounds.  ?Abdominal:  ?   General: Abdomen is flat.  ?   Palpations: Abdomen is soft.  ?   Tenderness: There is generalized abdominal tenderness.  ?Musculoskeletal:     ?   General: Normal range of motion.  ?   Cervical back: Normal range of motion.  ?   Right lower leg: No edema.  ?   Left lower leg: No edema.  ?Skin: ?   General: Skin is warm and dry.  ?   Capillary Refill: Capillary refill takes less than 2 seconds.  ? ?    ?   Comments: Erythema and discomfort to right lower extremity extending proximal to the right knee.  ? ?Lymphedema noted bilateral, right greater than left.  Palpable  DP pulses equal bilateral  ?Neurological:  ?   Mental Status: He is alert and oriented to person, place, and time.  ?Psychiatric:     ?   Mood and Affect: Mood normal.     ?   Behavior: Behavior normal.  ? ? ?ED Results / Procedures / Treatments   ?Labs ?(all labs ordered are listed, but only abnormal results are displayed) ?Labs Reviewed  ?COMPREHENSIVE METABOLIC PANEL - Abnormal; Notable for the following components:  ?    Result Value  ? Sodium 132 (*)   ? Potassium 3.2 (*)   ? Glucose, Bld 110 (*)   ? Calcium 8.8 (*)   ? Total Protein 8.3 (*)   ? All other components within normal limits  ?CBC WITH DIFFERENTIAL/PLATELET - Abnormal; Notable for the following components:  ? WBC 17.0 (*)   ? Neutro Abs 14.7 (*)   ? Monocytes Absolute 1.2 (*)   ? Abs Immature Granulocytes 0.14 (*)   ? All other components within normal limits  ?PROTIME-INR - Abnormal; Notable for the following components:  ? Prothrombin Time 16.4 (*)   ? INR 1.3 (*)   ? All other components within normal limits  ?PHOSPHORUS - Abnormal; Notable for the following components:  ? Phosphorus 2.3 (*)   ? All other components within normal limits  ?RESP PANEL BY RT-PCR (FLU A&B, COVID) [id number]  ?CULTURE, BLOOD (ROUTINE X 2)  ?CULTURE, BLOOD (ROUTINE X 2)  ?LACTIC ACID, PLASMA  ?APTT  ?LIPASE, BLOOD  ?BRAIN NATRIURETIC PEPTIDE  ?MAGNESIUM  ?HIV ANTIBODY (ROUTINE TESTING W REFLEX)  ?CBC  ?BASIC METABOLIC PANEL  ? ? ?EKG ?EKG Interpretation ? ?Date/Time:  Friday March 28 2021 10:04:51 EDT ?Ventricular Rate:  103 ?PR Interval:  160 ?QRS Duration: 103 ?QT Interval:  322 ?QTC Calculation: 422 ?R Axis:   65 ?Text Interpretation: Sinus tachycardia No old tracing to compare Confirmed by Wynona Dove ([id number]) on 03/28/2021 10:30:07 AM ? ?Radiology ?US Venous Img Lower Unilateral Right ? ?Result Date: 03/28/2021 ?CLINICAL DATA:  Right lower extremity pain and edema for the past 3 days. History of lymphedema. Evaluate for DVT. EXAM: RIGHT LOWER EXTREMITY VENOUS DOPPLER  ULTRASOUND TECHNIQUE: Kharson Rasmusson-scale sonography with graded compression, as well as color Doppler and duplex ultrasound were performed to evaluate the lower extremity deep venous systems from the level of the common femoral vein and including the common femoral, femoral, profunda femoral, popliteal and calf veins including the posterior tibial, peroneal and gastrocnemius veins when visible. The superficial great saphenous vein was also interrogated. Spectral Doppler was utilized to evaluate flow at rest and with distal augmentation maneuvers in the common femoral, femoral and popliteal veins. COMPARISON:  Left lower extremity venous Doppler ultrasound-05/29/2020 (negative). FINDINGS:  Examination is degraded due to patient body habitus and poor sonographic window. Contralateral Common Femoral Vein: Respiratory phasicity is normal and symmetric with the symptomatic side. No evidence of thrombus. Normal compressibility. Common Femoral Vein: No evidence of thrombus. Normal compressibility, respiratory phasicity and response to augmentation. Saphenofemoral Junction: No evidence of thrombus. Normal compressibility and flow on color Doppler imaging. Profunda Femoral Vein: No evidence of thrombus. Normal compressibility and flow on color Doppler imaging. Femoral Vein: No evidence of thrombus. Normal compressibility, respiratory phasicity and response to augmentation. Popliteal Vein: No evidence of thrombus. Normal compressibility, respiratory phasicity and response to augmentation. Calf Veins: Not well visualized Superficial Great Saphenous Vein: No evidence of thrombus. Normal compressibility. Venous Reflux:  None. Other Findings: Note is made of mildly prominent though non pathologically enlarged inguinal lymph nodes with index right inguinal lymph node measuring 1.2 cm in greatest short axis diameter (image 21), presumably reactive in etiology. IMPRESSION: No evidence of DVT within the right lower extremity. Electronically  Signed   By: Sandi Mariscal M.D.   On: 03/28/2021 11:14   ? ?Procedures ?Marland KitchenCritical Care ?Performed by: Jeanell Sparrow, DO ?Authorized by: Jeanell Sparrow, DO  ? ?Critical care provider statement:  ?  Critical care tim

## 2021-03-28 NOTE — ED Notes (Signed)
Family updated on room assignment, and departure via [facility] to [facility] ?

## 2021-03-29 DIAGNOSIS — L03116 Cellulitis of left lower limb: Secondary | ICD-10-CM | POA: Diagnosis not present

## 2021-03-29 DIAGNOSIS — A419 Sepsis, unspecified organism: Secondary | ICD-10-CM | POA: Diagnosis not present

## 2021-03-29 DIAGNOSIS — E876 Hypokalemia: Secondary | ICD-10-CM

## 2021-03-29 DIAGNOSIS — E871 Hypo-osmolality and hyponatremia: Secondary | ICD-10-CM

## 2021-03-29 DIAGNOSIS — I890 Lymphedema, not elsewhere classified: Secondary | ICD-10-CM | POA: Diagnosis not present

## 2021-03-29 DIAGNOSIS — L0390 Cellulitis, unspecified: Secondary | ICD-10-CM | POA: Diagnosis not present

## 2021-03-29 DIAGNOSIS — D72829 Elevated white blood cell count, unspecified: Secondary | ICD-10-CM

## 2021-03-29 LAB — CBC
HCT: 41.2 % (ref 39.0–52.0)
Hemoglobin: 13.5 g/dL (ref 13.0–17.0)
MCH: 28.5 pg (ref 26.0–34.0)
MCHC: 32.8 g/dL (ref 30.0–36.0)
MCV: 86.9 fL (ref 80.0–100.0)
Platelets: 246 10*3/uL (ref 150–400)
RBC: 4.74 MIL/uL (ref 4.22–5.81)
RDW: 13.4 % (ref 11.5–15.5)
WBC: 10.7 10*3/uL — ABNORMAL HIGH (ref 4.0–10.5)
nRBC: 0 % (ref 0.0–0.2)

## 2021-03-29 LAB — BASIC METABOLIC PANEL
Anion gap: 7 (ref 5–15)
BUN: 12 mg/dL (ref 6–20)
CO2: 24 mmol/L (ref 22–32)
Calcium: 8.4 mg/dL — ABNORMAL LOW (ref 8.9–10.3)
Chloride: 104 mmol/L (ref 98–111)
Creatinine, Ser: 1.11 mg/dL (ref 0.61–1.24)
GFR, Estimated: >60 mL/min (ref >60)
Glucose, Bld: 113 mg/dL — ABNORMAL HIGH (ref 70–99)
Potassium: 3.6 mmol/L (ref 3.5–5.1)
Sodium: 135 mmol/L (ref 135–145)

## 2021-03-29 LAB — HIV ANTIBODY (ROUTINE TESTING W REFLEX): HIV Screen 4th Generation wRfx: NONREACTIVE

## 2021-03-29 MED ORDER — SODIUM CHLORIDE 0.9 % IV SOLN
2.0000 g | INTRAVENOUS | Status: DC
  Administered 2021-03-29 – 2021-03-30 (×2): 2 g via INTRAVENOUS
  Filled 2021-03-29 (×3): qty 20

## 2021-03-29 MED ORDER — CLOTRIMAZOLE 1 % EX CREA
TOPICAL_CREAM | Freq: Two times a day (BID) | CUTANEOUS | Status: DC
  Administered 2021-03-30: 1 via TOPICAL
  Filled 2021-03-29: qty 15

## 2021-03-29 NOTE — Progress Notes (Signed)
?PROGRESS NOTE ? ? ? ?Omar Hayden  ZMO:[id number] DOB: 1986-01-05 DOA: 03/28/2021 ?PCP: Pcp, No  ? ?Brief Narrative:  ?36 year old male with history of chronic lymphedema presented with bilateral lower extremity pain, swelling with fever and chills.  On presentation, he was tachycardic with borderline low blood pressure and WBCs of 17,000.  He was started on antibiotics for lower extremity cellulitis. ? ?Assessment & Plan: ?  ?Sepsis: Present on admission ?Bilateral lower extremity cellulitis ?Possible Candida intertrigo ?-Presented with tachycardia, borderline low blood pressures, leukocytosis and lower extremity cellulitis. ?-Currently on Vanco and Diflucan.  Follow cultures.  We will add Rocephin.  Will add topical clotrimazole ?-Limb elevation ? ?Leukocytosis ?-Resolved ? ?Hyponatremia ?-Resolved ? ?Hypokalemia ?-Resolved ? ?Obesity ?-Outpatient follow-up ? ? ?DVT prophylaxis: Lovenox ?Code Status: Full ?Family Communication: None ?Disposition Plan: ?Status is: Inpatient ?Remains inpatient appropriate because: Of need for IV antibiotics ? ? ? ?Consultants: None ? ?Procedures: None ? ?Antimicrobials: Vancomycin and Diflucan from 03/28/2021 onwards ? ? ?Subjective: ?Patient seen and examined at bedside. .  No overnight vomiting, worsening shortness of breath reported.  Had fever yesterday.  Feels that he still has bilateral lower extremity pain but improving. ? ?Objective: ?Vitals:  ? 03/28/21 1806 03/28/21 2145 03/29/21 0136 03/29/21 0601  ?BP: (!) 151/88 130/76 137/83 137/65  ?Pulse: (!) 102 100 88 99  ?Resp: 20 20 20 20   ?Temp: 98.4 ?F (36.9 ?C) (!) 100.4 ?F (38 ?C) 99.1 ?F (37.3 ?C) 99.5 ?F (37.5 ?C)  ?TempSrc: Oral Oral Oral Oral  ?SpO2: 98% 98% 97% 94%  ?Weight:      ?Height:      ? ? ?Intake/Output Summary (Last 24 hours) at 03/29/2021 1010 ?Last data filed at 03/28/2021 1510 ?Gross per 24 hour  ?Intake 2695 ml  ?Output --  ?Net 2695 ml  ? ?Filed Weights  ? 03/28/21 0853  ?Weight: 121.1 kg   ? ? ?Examination: ? ?General exam: Appears calm and comfortable.  On room air. ?Respiratory system: Bilateral decreased breath sounds at bases ?Cardiovascular system: S1 & S2 heard, Rate controlled ?Gastrointestinal system: Abdomen is obese, nondistended, soft and nontender. Normal bowel sounds heard. ?Extremities: No cyanosis, clubbing; bilateral lower extremity edema present  ?Central nervous system: Alert and oriented. No focal neurological deficits. Moving extremities ?Skin: Bilateral lower extremity lymphedema present with erythema, tenderness and swelling of bilateral lower extremities, more on the right side with no discharge ?Psychiatry: Judgement and insight appear normal. Mood & affect appropriate.  ? ? ? ?Data Reviewed: I have personally reviewed following labs and imaging studies ? ?CBC: ?Recent Labs  ?Lab 03/28/21 ?0958 03/29/21 ?0540  ?WBC 17.0* 10.7*  ?NEUTROABS 14.7*  --   ?HGB 14.8 13.5  ?HCT 43.7 41.2  ?MCV 85.2 86.9  ?PLT 282 246  ? ?Basic Metabolic Panel: ?Recent Labs  ?Lab 03/28/21 ?0958 03/28/21 ?1141 03/29/21 ?0540  ?NA 132*  --  135  ?K 3.2*  --  3.6  ?CL 98  --  104  ?CO2 24  --  24  ?GLUCOSE 110*  --  113*  ?BUN 13  --  12  ?CREATININE 1.20  --  1.11  ?CALCIUM 8.8*  --  8.4*  ?MG  --  1.7  --   ?PHOS  --  2.3*  --   ? ?GFR: ?Estimated Creatinine Clearance: 128.5 mL/min (by C-G formula based on SCr of 1.11 mg/dL). ?Liver Function Tests: ?Recent Labs  ?Lab 03/28/21 ?0958  ?AST 21  ?ALT 30  ?ALKPHOS 82  ?BILITOT 1.2  ?  PROT 8.3*  ?ALBUMIN 3.5  ? ?Recent Labs  ?Lab 03/28/21 ?0958  ?LIPASE 24  ? ?No results for input(s): AMMONIA in the last 168 hours. ?Coagulation Profile: ?Recent Labs  ?Lab 03/28/21 ?0958  ?INR 1.3*  ? ?Cardiac Enzymes: ?No results for input(s): CKTOTAL, CKMB, CKMBINDEX, TROPONINI in the last 168 hours. ?BNP (last 3 results) ?No results for input(s): PROBNP in the last 8760 hours. ?HbA1C: ?No results for input(s): HGBA1C in the last 72 hours. ?CBG: ?No results for input(s):  GLUCAP in the last 168 hours. ?Lipid Profile: ?No results for input(s): CHOL, HDL, LDLCALC, TRIG, CHOLHDL, LDLDIRECT in the last 72 hours. ?Thyroid Function Tests: ?No results for input(s): TSH, T4TOTAL, FREET4, T3FREE, THYROIDAB in the last 72 hours. ?Anemia Panel: ?No results for input(s): VITAMINB12, FOLATE, FERRITIN, TIBC, IRON, RETICCTPCT in the last 72 hours. ?Sepsis Labs: ?Recent Labs  ?Lab 03/28/21 ?0958  ?LATICACIDVEN 1.2  ? ? ?Recent Results (from the past 240 hour(s))  ?Blood Culture (routine x 2)     Status: None (Preliminary result)  ? Collection Time: 03/28/21  9:58 AM  ? Specimen: BLOOD  ?Result Value Ref Range Status  ? Specimen Description   Final  ?  BLOOD RIGHT ANTECUBITAL ?Performed at [facility] [place], [address] [place], [place] [address] ?  ? Special Requests   Final  ?  BOTTLES DRAWN AEROBIC AND ANAEROBIC Blood Culture adequate volume ?Performed at [facility] [place], [address] [place], [place] [address] ?  ? Culture   Final  ?  NO GROWTH < 24 HOURS ?Performed at [facility], [address] [place], [place] [address] ?  ? Report Status PENDING  Incomplete  ?Resp Panel by RT-PCR (Flu A&B, Covid) Nasopharyngeal Swab     Status: None  ? Collection Time: 03/28/21  9:58 AM  ? Specimen: Nasopharyngeal Swab; Nasopharyngeal(NP) swabs in vial transport medium  ?Result Value Ref Range Status  ? SARS Coronavirus 2 by RT PCR NEGATIVE NEGATIVE Final  ?  Comment: (NOTE) ?SARS-CoV-2 target nucleic acids are NOT DETECTED. ? ?The SARS-CoV-2 RNA is generally detectable in upper respiratory ?specimens during the acute phase of infection. The lowest ?concentration of SARS-CoV-2 viral copies this assay can detect is ?138 copies/mL. A negative result does not preclude SARS-Cov-2 ?infection and should not be used as the sole basis for treatment or ?other patient management decisions. A negative result may occur with  ?improper specimen collection/handling, submission of specimen  other ?than nasopharyngeal swab, presence of viral mutation(s) within the ?areas targeted by this assay, and inadequate number of viral ?copies(<138 copies/mL). A negative result must be combined with ?clinical observations, patient history, and epidemiological ?information. The expected result is Negative. ? ?Fact Sheet for Patients:  ?BloggerCourse.comhttps://www.fda.gov/media/152166/download ? ?Fact Sheet for Healthcare Providers:  ?SeriousBroker.ithttps://www.fda.gov/media/152162/download ? ?This test is no t yet approved or cleared by the [place] FDA and  ?has been authorized for detection and/or diagnosis of SARS-CoV-2 by ?FDA under an Emergency Use Authorization (EUA). This EUA will remain  ?in effect (meaning this test can be used) for the duration of the ?COVID-19 declaration under Section 564(b)(1) of the Act, 21 ?U.S.C.section 360bbb-3(b)(1), unless the authorization is terminated  ?or revoked sooner.  ? ? ?  ? Influenza A by PCR NEGATIVE NEGATIVE Final  ? Influenza B by PCR NEGATIVE NEGATIVE Final  ?  Comment: (NOTE) ?The Xpert Xpress SARS-CoV-2/FLU/RSV plus assay is intended as an aid ?in the diagnosis of influenza from Nasopharyngeal  swab specimens and ?should not be used as a sole basis for treatment. Nasal washings and ?aspirates are unacceptable for Xpert Xpress SARS-CoV-2/FLU/RSV ?testing. ? ?Fact Sheet for Patients: ?BloggerCourse.com ? ?Fact Sheet for Healthcare Providers: ?SeriousBroker.it ? ?This test is not yet approved or cleared by the [place] FDA and ?has been authorized for detection and/or diagnosis of SARS-CoV-2 by ?FDA under an Emergency Use Authorization (EUA). This EUA will remain ?in effect (meaning this test can be used) for the duration of the ?COVID-19 declaration under Section 564(b)(1) of the Act, 21 U.S.C. ?section 360bbb-3(b)(1), unless the authorization is terminated or ?revoked. ? ?Performed at [facility], [address]  [place], [place] [address] ?  ?Blood Culture (routine x 2)     Status: None (Preliminary result)  ? Collection Time: 03/28/21 11:00 AM  ? Specimen: BLOOD  ?Result Value Ref Range Status  ? Specimen Description   Final  ?  BLOOD BLOOD RIGHT FO

## 2021-03-30 DIAGNOSIS — E871 Hypo-osmolality and hyponatremia: Secondary | ICD-10-CM

## 2021-03-30 DIAGNOSIS — E876 Hypokalemia: Secondary | ICD-10-CM | POA: Diagnosis not present

## 2021-03-30 DIAGNOSIS — L03116 Cellulitis of left lower limb: Secondary | ICD-10-CM | POA: Diagnosis not present

## 2021-03-30 DIAGNOSIS — D72829 Elevated white blood cell count, unspecified: Secondary | ICD-10-CM

## 2021-03-30 DIAGNOSIS — L0390 Cellulitis, unspecified: Secondary | ICD-10-CM | POA: Diagnosis not present

## 2021-03-30 LAB — BASIC METABOLIC PANEL
Anion gap: 7 (ref 5–15)
BUN: 9 mg/dL (ref 6–20)
CO2: 25 mmol/L (ref 22–32)
Calcium: 8.4 mg/dL — ABNORMAL LOW (ref 8.9–10.3)
Chloride: 104 mmol/L (ref 98–111)
Creatinine, Ser: 0.88 mg/dL (ref 0.61–1.24)
GFR, Estimated: >60 mL/min (ref >60)
Glucose, Bld: 107 mg/dL — ABNORMAL HIGH (ref 70–99)
Potassium: 3.6 mmol/L (ref 3.5–5.1)
Sodium: 136 mmol/L (ref 135–145)

## 2021-03-30 LAB — CBC WITH DIFFERENTIAL/PLATELET
Abs Immature Granulocytes: 0.03 10*3/uL (ref 0.00–0.07)
Basophils Absolute: 0 10*3/uL (ref 0.0–0.1)
Basophils Relative: 0 %
Eosinophils Absolute: 0.2 10*3/uL (ref 0.0–0.5)
Eosinophils Relative: 2 %
HCT: 39 % (ref 39.0–52.0)
Hemoglobin: 12.8 g/dL — ABNORMAL LOW (ref 13.0–17.0)
Immature Granulocytes: 0 %
Lymphocytes Relative: 22 %
Lymphs Abs: 1.7 10*3/uL (ref 0.7–4.0)
MCH: 29 pg (ref 26.0–34.0)
MCHC: 32.8 g/dL (ref 30.0–36.0)
MCV: 88.2 fL (ref 80.0–100.0)
Monocytes Absolute: 0.9 10*3/uL (ref 0.1–1.0)
Monocytes Relative: 12 %
Neutro Abs: 4.9 10*3/uL (ref 1.7–7.7)
Neutrophils Relative %: 64 %
Platelets: 263 10*3/uL (ref 150–400)
RBC: 4.42 MIL/uL (ref 4.22–5.81)
RDW: 13.7 % (ref 11.5–15.5)
WBC: 7.7 10*3/uL (ref 4.0–10.5)
nRBC: 0 % (ref 0.0–0.2)

## 2021-03-30 LAB — MAGNESIUM: Magnesium: 2.1 mg/dL (ref 1.7–2.4)

## 2021-03-30 LAB — C-REACTIVE PROTEIN: CRP: 21.8 mg/dL — ABNORMAL HIGH (ref <1.0)

## 2021-03-30 MED ORDER — KETOROLAC TROMETHAMINE 30 MG/ML IJ SOLN
30.0000 mg | Freq: Once | INTRAMUSCULAR | Status: AC
  Administered 2021-03-30: 30 mg via INTRAVENOUS
  Filled 2021-03-30: qty 1

## 2021-03-30 NOTE — TOC Initial Note (Signed)
Transition of Care (TOC) - Initial/Assessment Note  ? ? ?Patient Details  ?Name: Omar Hayden ?MRN: [id number] ?Date of Birth: [birth year] ? ?Transition of Care (TOC) CM/SW Contact:    ?Cecille Po, RN ?Phone Number: ?03/30/2021, 4:39 PM ? ?Clinical Narrative:                 ? ?Patient from home with significant other.  No PCP on file.  Per attending, patient will also need lymphedema clinic eval and follow up.  Clinics not open on the weekend.   ?TOC will assist with clinic appts when clinics open.     ?TOC following for additional needs.   ? ? ?Expected Discharge Plan: Home/Self Care ?Barriers to Discharge: Continued Medical Work up ? ? ?Expected Discharge Plan and Services ?Expected Discharge Plan: Home/Self Care ?  ?  ?  ?Living arrangements for the past 2 months: Single Family Home ?                ?  ?Prior Living Arrangements/Services ?Living arrangements for the past 2 months: Single Family Home ?Lives with:: Significant Other, Minor Children ?Patient language and need for interpreter reviewed:: Yes ?Do you feel safe going back to the place where you live?: Yes      ?Need for Family Participation in Patient Care: Yes (Comment) ?Care giver support system in place?: Yes (comment) ?  ?Criminal Activity/Legal Involvement Pertinent to Current Situation/Hospitalization: No - Comment as needed ? ?Activities of Daily Living ?Home Assistive Devices/Equipment: None ?ADL Screening (condition at time of admission) ?Patient's cognitive ability adequate to safely complete daily activities?: Yes ?Is the patient deaf or have difficulty hearing?: No ?Does the patient have difficulty seeing, even when wearing glasses/contacts?: No ?Does the patient have difficulty concentrating, remembering, or making decisions?: No ?Patient able to express need for assistance with ADLs?: Yes ?Does the patient have difficulty dressing or bathing?: No ?Independently performs ADLs?: Yes (appropriate for developmental age) ?Does the patient  have difficulty walking or climbing stairs?: Yes ?Weakness of Legs: Right ?Weakness of Arms/Hands: None ? ?  ?Orientation: : Oriented to Self, Oriented to Place, Oriented to  Time, Oriented to Situation ?Alcohol / Substance Use: Not Applicable ?Psych Involvement: No (comment) ? ?Admission diagnosis:  Cellulitis of right lower extremity [L03.115] ?Sepsis due to cellulitis (HCC) [L03.90, A41.9] ?Sepsis, due to unspecified organism, unspecified whether acute organ dysfunction present (HCC) [A41.9] ?Cellulitis of left lower extremity [L03.116] ?Patient Active Problem List  ? Diagnosis Date Noted  ? Hyponatremia 03/29/2021  ? Hypokalemia 03/29/2021  ? Leukocytosis 03/29/2021  ? Sepsis due to cellulitis (HCC) 03/28/2021  ? Lymphedema of both lower extremities 03/28/2021  ? Cellulitis of left lower extremity 03/28/2021  ? ?PCP:  Pcp, No ?Pharmacy:   ?[facility] ?[address], [address] ?[place] [address] ?Phone: [phone] Fax: [phone] ? ? ?Readmission Risk Interventions ?   ? View : No data to display.  ?  ?  ?  ? ? ? ?

## 2021-03-30 NOTE — Progress Notes (Signed)
?PROGRESS NOTE ? ? ? ?Omar Hayden  POE:[id number] DOB: [birth year] DOA: 03/28/2021 ?PCP: Pcp, No  ? ?Brief Narrative:  ?36 year old male with history of chronic lymphedema presented with bilateral lower extremity pain, swelling with fever and chills.  On presentation, he was tachycardic with borderline low blood pressure and WBCs of 17,000.  He was started on antibiotics for lower extremity cellulitis. ? ?Assessment & Plan: ?  ?Sepsis: Present on admission ?Bilateral lower extremity cellulitis ?Possible Candida intertrigo ?-Presented with tachycardia, borderline low blood pressures, leukocytosis and lower extremity cellulitis. ?-Continue Rocephin and vancomycin.  Also on Diflucan and topical clotrimazole ?-Limb elevation ? ?Leukocytosis ?-Resolved ? ?Chronic lymphedema ?-Will need outpatient follow-up with lymphedema clinic.  TOC consult to help out with the same ? ?Hyponatremia ?-Resolved ? ?Hypokalemia ?-Resolved ? ?Obesity ?-Outpatient follow-up ? ? ?DVT prophylaxis: Lovenox ?Code Status: Full ?Family Communication: Fianc? at bedside  ?disposition Plan: ?Status is: Inpatient ?Remains inpatient appropriate because: Of need for IV antibiotics ? ? ? ?Consultants: None ? ?Procedures: None ? ?Antimicrobials: Vancomycin and Diflucan from 03/28/2021 onwards.  Rocephin from 03/29/2021 onwards ? ? ?Subjective: ?Patient seen and examined at bedside.  No fever, vomiting, worsening shortness of breath or chest pain reported.  Bilateral lower extremity pain and swelling are improving but still requiring intermittent pain medications. ?Objective: ?Vitals:  ? 03/29/21 0601 03/29/21 1316 03/29/21 2056 03/30/21 0542  ?BP: 137/65 110/65 (!) 150/85 (!) 150/74  ?Pulse: 99 95 94 69  ?Resp: 20 16 18 18   ?Temp: 99.5 ?F (37.5 ?C) 98.4 ?F (36.9 ?C) 99.1 ?F (37.3 ?C) 97.8 ?F (36.6 ?C)  ?TempSrc: Oral Oral Oral Oral  ?SpO2: 94% 97% 96% 100%  ?Weight:      ?Height:      ? ? ?Intake/Output Summary (Last 24 hours) at 03/30/2021 0807 ?Last data filed  at 03/30/2021 3154 ?Gross per 24 hour  ?Intake 1282.99 ml  ?Output 1 ml  ?Net 1281.99 ml  ? ? ?Filed Weights  ? 03/28/21 0853  ?Weight: 121.1 kg  ? ? ?Examination: ? ?General: On room air.  No distress ?ENT/neck: No thyromegaly.  JVD is not elevated  ?respiratory: Decreased breath sounds at bases bilaterally with some crackles; no wheezing CVS: S1-S2 heard, rate controlled ?Abdominal: Soft, obese, nontender, slightly distended; no organomegaly, normal bowel sounds are heard ?Extremities:  Bilateral lower extremity lymphedema present with erythema, tenderness and swelling of bilateral lower extremities, more on the right side with no discharge.  Slightly improved compared to yesterday. ? ? ?Data Reviewed: I have personally reviewed following labs and imaging studies ? ?CBC: ?Recent Labs  ?Lab 03/28/21 ?0958 03/29/21 ?0086 03/30/21 ?7619  ?WBC 17.0* 10.7* 7.7  ?NEUTROABS 14.7*  --  4.9  ?HGB 14.8 13.5 12.8*  ?HCT 43.7 41.2 39.0  ?MCV 85.2 86.9 88.2  ?PLT 282 246 263  ? ? ?Basic Metabolic Panel: ?Recent Labs  ?Lab 03/28/21 ?0958 03/28/21 ?1141 03/29/21 ?5093 03/30/21 ?2671  ?NA 132*  --  135 136  ?K 3.2*  --  3.6 3.6  ?CL 98  --  104 104  ?CO2 24  --  24 25  ?GLUCOSE 110*  --  113* 107*  ?BUN 13  --  12 9  ?CREATININE 1.20  --  1.11 0.88  ?CALCIUM 8.8*  --  8.4* 8.4*  ?MG  --  1.7  --  2.1  ?PHOS  --  2.3*  --   --   ? ? ?GFR: ?Estimated Creatinine Clearance: 162.1 mL/min (by C-G formula based on  SCr of 0.88 mg/dL). ?Liver Function Tests: ?Recent Labs  ?Lab 03/28/21 ?[id number]  ?AST 21  ?ALT 30  ?ALKPHOS 82  ?BILITOT 1.2  ?PROT 8.3*  ?ALBUMIN 3.5  ? ? ?Recent Labs  ?Lab 03/28/21 ?[id number]  ?LIPASE 24  ? ? ?No results for input(s): AMMONIA in the last 168 hours. ?Coagulation Profile: ?Recent Labs  ?Lab 03/28/21 ?[id number]  ?INR 1.3*  ? ? ?Cardiac Enzymes: ?No results for input(s): CKTOTAL, CKMB, CKMBINDEX, TROPONINI in the last 168 hours. ?BNP (last 3 results) ?No results for input(s): PROBNP in the last 8760 hours. ?HbA1C: ?No  results for input(s): HGBA1C in the last 72 hours. ?CBG: ?No results for input(s): GLUCAP in the last 168 hours. ?Lipid Profile: ?No results for input(s): CHOL, HDL, LDLCALC, TRIG, CHOLHDL, LDLDIRECT in the last 72 hours. ?Thyroid Function Tests: ?No results for input(s): TSH, T4TOTAL, FREET4, T3FREE, THYROIDAB in the last 72 hours. ?Anemia Panel: ?No results for input(s): VITAMINB12, FOLATE, FERRITIN, TIBC, IRON, RETICCTPCT in the last 72 hours. ?Sepsis Labs: ?Recent Labs  ?Lab 03/28/21 ?[id number]  ?LATICACIDVEN 1.2  ? ? ? ?Recent Results (from the past 240 hour(s))  ?Blood Culture (routine x 2)     Status: None (Preliminary result)  ? Collection Time: 03/28/21  9:58 AM  ? Specimen: BLOOD  ?Result Value Ref Range Status  ? Specimen Description   Final  ?  BLOOD RIGHT ANTECUBITAL ?Performed at [facility], [address] [place], [place] [address] ?  ? Special Requests   Final  ?  BOTTLES DRAWN AEROBIC AND ANAEROBIC Blood Culture adequate volume ?Performed at [facility], [address] [place], [place] [address] ?  ? Culture   Final  ?  NO GROWTH < 24 HOURS ?Performed at [facility], [place] [address] [place], [place] [address] ?  ? Report Status PENDING  Incomplete  ?Resp Panel by RT-PCR (Flu A&B, Covid) Nasopharyngeal Swab     Status: None  ? Collection Time: 03/28/21  9:58 AM  ? Specimen: Nasopharyngeal Swab; Nasopharyngeal(NP) swabs in vial transport medium  ?Result Value Ref Range Status  ? SARS Coronavirus 2 by RT PCR NEGATIVE NEGATIVE Final  ?  Comment: (NOTE) ?SARS-CoV-2 target nucleic acids are NOT DETECTED. ? ?The SARS-CoV-2 RNA is generally detectable in upper respiratory ?specimens during the acute phase of infection. The lowest ?concentration of SARS-CoV-2 viral copies this assay can detect is ?138 copies/mL. A negative result does not preclude SARS-Cov-2 ?infection and should not be used as the sole basis for treatment or ?other patient management decisions. A negative result  may occur with  ?improper specimen collection/handling, submission of specimen other ?than nasopharyngeal swab, presence of viral mutation(s) within the ?areas targeted by this assay, and inadequate number of viral ?copies(<138 copies/mL). A negative result must be combined with ?clinical observations, patient history, and epidemiological ?information. The expected result is Negative. ? ?Fact Sheet for Patients:  ?EntrepreneurPulse.com.au ? ?Fact Sheet for Healthcare Providers:  ?IncredibleEmployment.be ? ?This test is no t yet approved or cleared by the [place] FDA and  ?has been authorized for detection and/or diagnosis of SARS-CoV-2 by ?FDA under an Emergency Use Authorization (EUA). This EUA will remain  ?in effect (meaning this test can be used) for the duration of the ?COVID-19 declaration under Section 564(b)(1) of the Act, 21 ?U.S.C.section 360bbb-3(b)(1), unless the authorization is terminated  ?or revoked sooner.  ? ? ?  ? Influenza A by PCR NEGATIVE NEGATIVE Final  ?  Influenza B by PCR NEGATIVE NEGATIVE Final  ?  Comment: (NOTE) ?The Xpert Xpress SARS-CoV-2/FLU/RSV plus assay is intended as an aid ?in the diagnosis of influenza from Nasopharyngeal swab specimens and ?should not be used as a sole basis for treatment. Nasal washings and ?aspirates are unacceptable for Xpert Xpress SARS-CoV-2/FLU/RSV ?testing. ? ?Fact Sheet for Patients: ?EntrepreneurPulse.com.au ? ?Fact Sheet for Healthcare Providers: ?IncredibleEmployment.be ? ?This test is not yet approved or cleared by the [place] FDA and ?has been authorized for detection and/or diagnosis of SARS-CoV-2 by ?FDA under an Emergency Use Authorization (EUA). This EUA will remain ?in effect (meaning this test can be used) for the duration of the ?COVID-19 declaration under Section 564(b)(1) of the Act, 21 U.S.C. ?section 360bbb-3(b)(1), unless the authorization is terminated  or ?revoked. ? ?Performed at [facility], [address] [place], [place] [address] ?  ?Blood Culture (routine x 2)     Status: None (Preliminary result)  ? Collection Time: 03/28/21 11:00 AM

## 2021-03-31 DIAGNOSIS — E876 Hypokalemia: Secondary | ICD-10-CM | POA: Diagnosis not present

## 2021-03-31 DIAGNOSIS — L0390 Cellulitis, unspecified: Secondary | ICD-10-CM | POA: Diagnosis not present

## 2021-03-31 DIAGNOSIS — L03116 Cellulitis of left lower limb: Secondary | ICD-10-CM | POA: Diagnosis not present

## 2021-03-31 DIAGNOSIS — D72829 Elevated white blood cell count, unspecified: Secondary | ICD-10-CM | POA: Diagnosis not present

## 2021-03-31 MED ORDER — CEPHALEXIN 500 MG PO CAPS: 500.0000 mg | ORAL_CAPSULE | Freq: Four times a day (QID) | ORAL | 0 refills | Status: DC

## 2021-03-31 MED ORDER — CLOTRIMAZOLE 1 % EX CREA: TOPICAL_CREAM | Freq: Two times a day (BID) | CUTANEOUS | 1 refills | Status: DC

## 2021-03-31 MED ORDER — HYDROCODONE-ACETAMINOPHEN 5-325 MG PO TABS: 1.0000 | ORAL_TABLET | Freq: Four times a day (QID) | ORAL | 0 refills | Status: DC | PRN

## 2021-03-31 NOTE — Progress Notes (Signed)
Discharge instructions explained to [facility]. Three prescriptions given to patient to have filled. Next medication doses written on discharge instructions. Patient discharged via wheelchair. Girlfriend picking him up to take him home. ?

## 2021-03-31 NOTE — Discharge Summary (Signed)
Physician Discharge Summary  ?Omar Hayden [id number] DOB: [birth year] DOA: 03/28/2021 ? ?PCP: Pcp, No ? ?Admit date: 03/28/2021 ?Discharge date: 03/31/2021 ? ?Admitted From: Home ?Disposition: Home ? ?Recommendations for Outpatient Follow-up:  ?Follow up with PCP in 1 week ?Recommend outpatient evaluation and follow-up by lymphedema clinic ?Follow up in ED if symptoms worsen or new appear ? ? ?Home Health: No ?Equipment/Devices: None ? ?Discharge Condition: Stable ?CODE STATUS: Full ?Diet recommendation: Regular ? ?Brief/Interim Summary: ?36 year old male with history of chronic lymphedema presented with bilateral lower extremity pain, swelling with fever and chills.  On presentation, he was tachycardic with borderline low blood pressure and WBCs of 17,000.  He was started on antibiotics for lower extremity cellulitis.  During the hospitalization, his condition has improved.  He will be discharged home today on oral Keflex for 7 more days.  Will need outpatient lymphedema clinic evaluation and follow-up. ? ?Discharge Diagnoses:  ? ?Sepsis: Present on admission ?Bilateral lower extremity cellulitis ?Possible Candida intertrigo ?-Presented with tachycardia, borderline low blood pressures, leukocytosis and lower extremity cellulitis. ?-Currently on Rocephin and vancomycin.  Also on Diflucan and topical clotrimazole ?-Cellulitis has much improved.  He will be discharged home today on oral Keflex for 7 days and topical clotrimazole to be applied between the toes.  Outpatient follow-up with PCP. ?-Sepsis has resolved ?  ?Leukocytosis ?-Resolved ? ?Chronic lymphedema ?-Will need outpatient follow-up with lymphedema clinic.  TOC consult to help out with the same ? ?Hyponatremia ?-Resolved ? ?Hypokalemia ?-Resolved ?  ?Obesity ?-Outpatient follow-up ? ?Discharge Instructions ? ? ?Allergies as of 03/31/2021   ?No Known Allergies ?  ? ?  ?Medication List  ?  ? ?TAKE these medications   ? ?cephALEXin 500 MG capsule ?Commonly known  as: KEFLEX ?Take 1 capsule (500 mg total) by mouth 4 (four) times daily. ?  ?clotrimazole 1 % cream ?Commonly known as: LOTRIMIN ?Apply topically 2 (two) times daily. Apply topically to affected toes BID ?  ?HYDROcodone-acetaminophen 5-325 MG tablet ?Commonly known as: Norco ?Take 1 tablet by mouth every 6 (six) hours as needed for severe pain. ?  ? ?  ? ? ? Follow-up Information   ? ? PCP. Schedule an appointment as soon as possible for a visit in 1 week(s).   ? ?  ?  ? ?  ?  ? ?  ? ?No Known Allergies ? ?Consultations: ?None ? ? ?Procedures/Studies: ?US Venous Img Lower Unilateral Right ? ?Result Date: 03/28/2021 ?CLINICAL DATA:  Right lower extremity pain and edema for the past 3 days. History of lymphedema. Evaluate for DVT. EXAM: RIGHT LOWER EXTREMITY VENOUS DOPPLER ULTRASOUND TECHNIQUE: Gray-scale sonography with graded compression, as well as color Doppler and duplex ultrasound were performed to evaluate the lower extremity deep venous systems from the level of the common femoral vein and including the common femoral, femoral, profunda femoral, popliteal and calf veins including the posterior tibial, peroneal and gastrocnemius veins when visible. The superficial great saphenous vein was also interrogated. Spectral Doppler was utilized to evaluate flow at rest and with distal augmentation maneuvers in the common femoral, femoral and popliteal veins. COMPARISON:  Left lower extremity venous Doppler ultrasound-05/29/2020 (negative). FINDINGS: Examination is degraded due to patient body habitus and poor sonographic window. Contralateral Common Femoral Vein: Respiratory phasicity is normal and symmetric with the symptomatic side. No evidence of thrombus. Normal compressibility. Common Femoral Vein: No evidence of thrombus. Normal compressibility, respiratory phasicity and response to augmentation. Saphenofemoral Junction: No evidence of thrombus. Normal compressibility and flow  on color Doppler imaging. Profunda  Femoral Vein: No evidence of thrombus. Normal compressibility and flow on color Doppler imaging. Femoral Vein: No evidence of thrombus. Normal compressibility, respiratory phasicity and response to augmentation. Popliteal Vein: No evidence of thrombus. Normal compressibility, respiratory phasicity and response to augmentation. Calf Veins: Not well visualized Superficial Great Saphenous Vein: No evidence of thrombus. Normal compressibility. Venous Reflux:  None. Other Findings: Note is made of mildly prominent though non pathologically enlarged inguinal lymph nodes with index right inguinal lymph node measuring 1.2 cm in greatest short axis diameter (image 21), presumably reactive in etiology. IMPRESSION: No evidence of DVT within the right lower extremity. Electronically Signed   By: Sandi Mariscal M.D.   On: 03/28/2021 11:14   ? ? ? ?Subjective: ?Patient seen and examined at bedside.  Feels much better and wants to go home today.  No overnight fever, nausea or vomiting reported. ? ?Discharge Exam: ?Vitals:  ? 03/30/21 2110 03/31/21 0516  ?BP: (!) 165/89 (!) 145/74  ?Pulse: 68 75  ?Resp: 18 18  ?Temp: 98 ?F (36.7 ?C) 98.3 ?F (36.8 ?C)  ?SpO2: 99% 96%  ? ? ?General: Pt is alert, awake, not in acute distress ?Cardiovascular: rate controlled, S1/S2 + ?Respiratory: bilateral decreased breath sounds at bases ?Abdominal: Soft, obese, NT, ND, bowel sounds + ?Extremities:  Bilateral lower extremity lymphedema present with mild erythema, tenderness and swelling of bilateral lower extremities; much improved ? ? ?The results of significant diagnostics from this hospitalization (including imaging, microbiology, ancillary and laboratory) are listed below for reference.   ? ? ?Microbiology: ?Recent Results (from the past 240 hour(s))  ?Blood Culture (routine x 2)     Status: None (Preliminary result)  ? Collection Time: 03/28/21  9:58 AM  ? Specimen: BLOOD  ?Result Value Ref Range Status  ? Specimen Description   Final  ?  BLOOD  RIGHT ANTECUBITAL ?Performed at [facility], [address] [place], [place] [address] ?  ? Special Requests   Final  ?  BOTTLES DRAWN AEROBIC AND ANAEROBIC Blood Culture adequate volume ?Performed at [facility], [address] [place], [place] [address] ?  ? Culture   Final  ?  NO GROWTH 2 DAYS ?Performed at [facility], [place] [address] [place], [place] [address] ?  ? Report Status PENDING  Incomplete  ?Resp Panel by RT-PCR (Flu A&B, Covid) Nasopharyngeal Swab     Status: None  ? Collection Time: 03/28/21  9:58 AM  ? Specimen: Nasopharyngeal Swab; Nasopharyngeal(NP) swabs in vial transport medium  ?Result Value Ref Range Status  ? SARS Coronavirus 2 by RT PCR NEGATIVE NEGATIVE Final  ?  Comment: (NOTE) ?SARS-CoV-2 target nucleic acids are NOT DETECTED. ? ?The SARS-CoV-2 RNA is generally detectable in upper respiratory ?specimens during the acute phase of infection. The lowest ?concentration of SARS-CoV-2 viral copies this assay can detect is ?138 copies/mL. A negative result does not preclude SARS-Cov-2 ?infection and should not be used as the sole basis for treatment or ?other patient management decisions. A negative result may occur with  ?improper specimen collection/handling, submission of specimen other ?than nasopharyngeal swab, presence of viral mutation(s) within the ?areas targeted by this assay, and inadequate number of viral ?copies(<138 copies/mL). A negative result must be combined with ?clinical observations, patient history, and epidemiological ?information. The expected result is Negative. ? ?Fact Sheet for Patients:  ?EntrepreneurPulse.com.au ? ?Fact Sheet for Healthcare Providers:  ?IncredibleEmployment.be ? ?This test is no  t yet approved or cleared by the [place] FDA and  ?has been authorized for detection and/or diagnosis of SARS-CoV-2 by ?FDA under an Emergency Use Authorization (EUA). This EUA will remain  ?in effect  (meaning this test can be used) for the duration of the ?COVID-19 declaration under Section 564(b)(1) of the Act, 21 ?U.S.C.section 360bbb-3(b)(1), unless the authorization is terminated  ?or revoked sooner

## 2021-03-31 NOTE — TOC Transition Note (Signed)
Transition of Care (TOC) - CM/SW Discharge Note ? ? ?Patient Details  ?Name: Omar Hayden ?MRN: [id number] ?Date of Birth: June 12, 1985 ? ?Transition of Care (TOC) CM/SW Contact:  ?Albie Bazin, Meriam Sprague, RN ?Phone Number: ?03/31/2021, 9:59 AM ? ? ?Clinical Narrative:    ?TOC consult for PCP and outpatient lymphedema therapy. Spoke with pt for dc planning. Informed him he would need to call the number on the back of his insurance card to see what PCPs take his insurance plan. He states he will do this. Outpatient referral make to [facility] for lymphedema therapy. They will call him to schedule appointment. Pt made aware and information placed on AVS. ? ? ?Final next level of care: Home/Self Care ?Barriers to Discharge: No Barriers Identified ? ? ?Patient Goals and CMS Choice ?Patient states their goals for this hospitalization and ongoing recovery are:: To go home ?  ?   ? ?Social Determinants of Health (SDOH) Interventions ?  ? ? ?Readmission Risk Interventions ? ?  03/31/2021  ?  9:58 AM  ?Readmission Risk Prevention Plan  ?Transportation Screening Complete  ?PCP or Specialist Appt within 5-7 Days Complete  ?Home Care Screening Complete  ?Medication Review (RN CM) Complete  ? ? ? ? ? ?

## 2021-04-02 LAB — CULTURE, BLOOD (ROUTINE X 2)
Culture: NO GROWTH
Culture: NO GROWTH
Special Requests: ADEQUATE
Special Requests: ADEQUATE

## 2021-05-06 ENCOUNTER — Encounter (HOSPITAL_BASED_OUTPATIENT_CLINIC_OR_DEPARTMENT_OTHER): Payer: Self-pay | Admitting: Emergency Medicine

## 2021-05-06 ENCOUNTER — Other Ambulatory Visit: Payer: Self-pay

## 2021-05-06 ENCOUNTER — Emergency Department (HOSPITAL_BASED_OUTPATIENT_CLINIC_OR_DEPARTMENT_OTHER)
Admission: EM | Admit: 2021-05-06 | Discharge: 2021-05-06 | Disposition: A | Discharge status: Home / Self Care | Payer: 59 | Attending: Emergency Medicine | Admitting: Emergency Medicine

## 2021-05-06 ENCOUNTER — Emergency Department (HOSPITAL_BASED_OUTPATIENT_CLINIC_OR_DEPARTMENT_OTHER): Payer: 59

## 2021-05-06 DIAGNOSIS — I890 Lymphedema, not elsewhere classified: Secondary | ICD-10-CM | POA: Insufficient documentation

## 2021-05-06 DIAGNOSIS — D72829 Elevated white blood cell count, unspecified: Secondary | ICD-10-CM | POA: Diagnosis not present

## 2021-05-06 DIAGNOSIS — E871 Hypo-osmolality and hyponatremia: Secondary | ICD-10-CM | POA: Insufficient documentation

## 2021-05-06 DIAGNOSIS — M79605 Pain in left leg: Secondary | ICD-10-CM | POA: Insufficient documentation

## 2021-05-06 LAB — CBC WITH DIFFERENTIAL/PLATELET
Abs Immature Granulocytes: 0.07 10*3/uL (ref 0.00–0.07)
Basophils Absolute: 0.1 10*3/uL (ref 0.0–0.1)
Basophils Relative: 0 %
Eosinophils Absolute: 0.1 10*3/uL (ref 0.0–0.5)
Eosinophils Relative: 1 %
HCT: 40.5 % (ref 39.0–52.0)
Hemoglobin: 13.5 g/dL (ref 13.0–17.0)
Immature Granulocytes: 0 %
Lymphocytes Relative: 10 %
Lymphs Abs: 1.6 10*3/uL (ref 0.7–4.0)
MCH: 28.5 pg (ref 26.0–34.0)
MCHC: 33.3 g/dL (ref 30.0–36.0)
MCV: 85.4 fL (ref 80.0–100.0)
Monocytes Absolute: 0.9 10*3/uL (ref 0.1–1.0)
Monocytes Relative: 6 %
Neutro Abs: 13.1 10*3/uL — ABNORMAL HIGH (ref 1.7–7.7)
Neutrophils Relative %: 83 %
Platelets: 302 10*3/uL (ref 150–400)
RBC: 4.74 MIL/uL (ref 4.22–5.81)
RDW: 13.9 % (ref 11.5–15.5)
WBC: 15.8 10*3/uL — ABNORMAL HIGH (ref 4.0–10.5)
nRBC: 0 % (ref 0.0–0.2)

## 2021-05-06 LAB — BASIC METABOLIC PANEL
Anion gap: 6 (ref 5–15)
BUN: 17 mg/dL (ref 6–20)
CO2: 23 mmol/L (ref 22–32)
Calcium: 8.5 mg/dL — ABNORMAL LOW (ref 8.9–10.3)
Chloride: 104 mmol/L (ref 98–111)
Creatinine, Ser: 0.94 mg/dL (ref 0.61–1.24)
GFR, Estimated: >60 mL/min (ref >60)
Glucose, Bld: 134 mg/dL — ABNORMAL HIGH (ref 70–99)
Potassium: 3.5 mmol/L (ref 3.5–5.1)
Sodium: 133 mmol/L — ABNORMAL LOW (ref 135–145)

## 2021-05-06 MED ORDER — RIVAROXABAN 15 MG PO TABS
15.0000 mg | ORAL_TABLET | Freq: Once | ORAL | Status: AC
  Administered 2021-05-06: 15 mg via ORAL
  Filled 2021-05-06: qty 1

## 2021-05-06 MED ORDER — CEPHALEXIN 500 MG PO CAPS: 500.0000 mg | ORAL_CAPSULE | Freq: Four times a day (QID) | ORAL | 0 refills | Status: DC

## 2021-05-06 MED ORDER — HYDROCODONE-ACETAMINOPHEN 5-325 MG PO TABS
1.0000 | ORAL_TABLET | Freq: Once | ORAL | Status: AC
  Administered 2021-05-06: 1 via ORAL
  Filled 2021-05-06: qty 1

## 2021-05-06 MED ORDER — OXYCODONE-ACETAMINOPHEN 5-325 MG PO TABS: 1.0000 | ORAL_TABLET | Freq: Four times a day (QID) | ORAL | 0 refills | Status: DC | PRN

## 2021-05-06 NOTE — ED Provider Notes (Signed)
?[facility] EMERGENCY DEPARTMENT ?Provider Note ? ? ?CSN: [id number] ?Arrival date & time: 05/06/21  0406 ? ?  ? ?History ? ?Chief Complaint  ?Patient presents with  ? Leg Pain  ? ? ?Omar Hayden is a 36 y.o. male. ? ?The history is provided by the patient.  ?Leg Pain ?He has history of chronic lymphedema of both legs and comes in because of pain in his left leg and increased swelling in his left leg over the last 3 days.  He denies fever, chills, sweats.  Denies chest pain or dyspnea.  He did have a similar episode about 6 weeks ago, but he had a fever with that and ended up having cellulitis and needed hospital admission.  He denies any trauma.  He has not taken anything for pain. ?  ?Home Medications ?Prior to Admission medications   ?Medication Sig Start Date End Date Taking? Authorizing Provider  ?cephALEXin (KEFLEX) 500 MG capsule Take 1 capsule (500 mg total) by mouth 4 (four) times daily. 03/31/21   Aline August, MD  ?clotrimazole (LOTRIMIN) 1 % cream Apply topically 2 (two) times daily. Apply topically to affected toes BID 03/31/21   Aline August, MD  ?HYDROcodone-acetaminophen (NORCO) 5-325 MG tablet Take 1 tablet by mouth every 6 (six) hours as needed for severe pain. 03/31/21   Aline August, MD  ?   ? ?Allergies    ?Patient has no known allergies.   ? ?Review of Systems   ?Review of Systems  ?All other systems reviewed and are negative. ? ?Physical Exam ?Updated Vital Signs ?BP (!) 142/93   Pulse 95   Temp 98.5 ?F (36.9 ?C) (Oral)   Resp 18   Ht 6\' 2"  (1.88 m)   Wt 121 kg   SpO2 94%   BMI 34.25 kg/m?  ?Physical Exam ?Vitals and nursing note reviewed.  ? ? ?ED Results / Procedures / Treatments   ?Labs ?(all labs ordered are listed, but only abnormal results are displayed) ?Labs Reviewed  ?CBC WITH DIFFERENTIAL/PLATELET - Abnormal; Notable for the following components:  ?    Result Value  ? WBC 15.8 (*)   ? Neutro Abs 13.1 (*)   ? All other components within normal limits  ?BASIC METABOLIC  PANEL - Abnormal; Notable for the following components:  ? Sodium 133 (*)   ? Glucose, Bld 134 (*)   ? Calcium 8.5 (*)   ? All other components within normal limits  ? ?None ? ?Radiology ?No results found. ? ?Procedures ?Procedures  ? ? ?Medications Ordered in ED ?Medications  ?HYDROcodone-acetaminophen (NORCO/VICODIN) 5-325 MG per tablet 1 tablet (has no administration in time range)  ?Rivaroxaban (XARELTO) tablet 15 mg (has no administration in time range)  ? ? ?ED Course/ Medical Decision Making/ A&P ?  ?                        ?Medical Decision Making ?Amount and/or Complexity of Data Reviewed ?Labs: ordered. ? ?Risk ?Prescription drug management. ? ? ?Left leg pain and patient with chronic lymphedema.  No fever or physical findings to suggest cellulitis, suspect just simple exacerbation of chronic lymphedema.  However, we will send for venous ultrasound to rule out DVT.  Old records are reviewed showing hospitalization for cellulitis of right leg on 3/24, venous ultrasound at that time was negative.  We will check screening labs today, give initial dose of rivaroxaban. ? ?Labs do show a mild leukocytosis which is nonspecific.  Mild hyponatremia is present which is not felt to be clinically significant.  Venous ultrasound is pending.  Case is signed out to Dr. Melina Copa. ? ?Final Clinical Impression(s) / ED Diagnoses ?Final diagnoses:  ?Pain in left leg  ?Chronic acquired lymphedema  ?Hyponatremia  ? ? ?Rx / DC Orders ?ED Discharge Orders   ? ? None  ? ?  ? ? ?  ?Delora Fuel, MD ?[id number] ? ?

## 2021-05-06 NOTE — Discharge Instructions (Addendum)
You were seen in the emergency department for increased pain and swelling of your leg.  Your ultrasound did not show any evidence of a blood clot.  We are putting you on some pain medication and antibiotics.  Please follow-up with your primary care doctor.  Return to the emergency department if any worsening or concerning symptoms. ?

## 2021-05-06 NOTE — ED Provider Notes (Signed)
36 year old male with chronic lymphedema here with worsening swelling left leg.  Recently treated for cellulitis.  No fevers here.  Plan is to follow-up on results of ultrasound of the leg.  If positive for DVT will need anticoagulation if negative will need treatment for pain control. ?Physical Exam  ?BP (!) 142/84   Pulse 75   Temp 98.5 ?F (36.9 ?C) (Oral)   Resp 16   Ht 6\' 2"  (1.88 m)   Wt 121 kg   SpO2 97%   BMI 34.25 kg/m?  ? ?Physical Exam ? ?Procedures  ?Procedures ? ?ED Course / MDM  ?  ?Medical Decision Making ?Amount and/or Complexity of Data Reviewed ?Labs: ordered. ? ?Risk ?Prescription drug management. ? ? ?Duplex negative for DVT.  Reviewed with patient.  He is asking to go on some antibiotics as he has had trouble with some infection in the past. ? ? ? ? ?  ? , MD ?05/06/21 1700 ? ?

## 2021-05-06 NOTE — ED Notes (Signed)
Pt awake and alert lying in bed - GCS 15.  Observed to be grunting like noises otherwise RR even and unlabored with symmetrical rise and fall of chest.  Pt denies BLE parasthesias; reports only BLE pain with associated nonpitting edema and mild erythema noted to RLE.  Call bell in reach.  Pt awaits ED provider eval.  Will monitor for acute changes and maintain plan of care  ?

## 2021-05-06 NOTE — ED Notes (Signed)
Provider at bedside to review results.

## 2021-05-06 NOTE — ED Notes (Signed)
Pt awake and alert lying flat in bed --  ?

## 2021-05-06 NOTE — ED Triage Notes (Signed)
Pt c/o leg pain bilaterally. Pt has lymphedema bilaterally ? ? ?

## 2021-06-23 ENCOUNTER — Emergency Department (HOSPITAL_BASED_OUTPATIENT_CLINIC_OR_DEPARTMENT_OTHER): Payer: 59

## 2021-06-23 ENCOUNTER — Other Ambulatory Visit: Payer: Self-pay

## 2021-06-23 ENCOUNTER — Encounter (HOSPITAL_BASED_OUTPATIENT_CLINIC_OR_DEPARTMENT_OTHER): Payer: Self-pay | Admitting: Urology

## 2021-06-23 ENCOUNTER — Emergency Department (HOSPITAL_BASED_OUTPATIENT_CLINIC_OR_DEPARTMENT_OTHER)
Admission: EM | Admit: 2021-06-23 | Discharge: 2021-06-23 | Disposition: A | Discharge status: Home / Self Care | Payer: 59 | Attending: Emergency Medicine | Admitting: Emergency Medicine

## 2021-06-23 DIAGNOSIS — M25512 Pain in left shoulder: Secondary | ICD-10-CM | POA: Diagnosis not present

## 2021-06-23 DIAGNOSIS — M25532 Pain in left wrist: Secondary | ICD-10-CM | POA: Insufficient documentation

## 2021-06-23 DIAGNOSIS — M79632 Pain in left forearm: Secondary | ICD-10-CM | POA: Diagnosis not present

## 2021-06-23 MED ORDER — MELOXICAM 7.5 MG PO TABS
15.0000 mg | ORAL_TABLET | Freq: Once | ORAL | Status: AC
  Administered 2021-06-23: 15 mg via ORAL
  Filled 2021-06-23: qty 2

## 2021-06-23 MED ORDER — MELOXICAM 15 MG PO TABS
15.0000 mg | ORAL_TABLET | Freq: Every day | ORAL | 0 refills | Status: AC
Start: 2021-06-23 — End: 2021-07-08
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
## 2021-09-14 ENCOUNTER — Other Ambulatory Visit: Payer: Self-pay

## 2021-09-14 ENCOUNTER — Emergency Department (HOSPITAL_BASED_OUTPATIENT_CLINIC_OR_DEPARTMENT_OTHER)
Admission: EM | Admit: 2021-09-14 | Discharge: 2021-09-15 | Disposition: A | Discharge status: Home / Self Care | Payer: 59 | Attending: Emergency Medicine | Admitting: Emergency Medicine

## 2021-09-14 ENCOUNTER — Encounter (HOSPITAL_BASED_OUTPATIENT_CLINIC_OR_DEPARTMENT_OTHER): Payer: Self-pay | Admitting: Emergency Medicine

## 2021-09-14 ENCOUNTER — Emergency Department (HOSPITAL_BASED_OUTPATIENT_CLINIC_OR_DEPARTMENT_OTHER): Payer: 59

## 2021-09-14 DIAGNOSIS — S4991XS Unspecified injury of right shoulder and upper arm, sequela: Secondary | ICD-10-CM | POA: Insufficient documentation
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
## 2021-09-15 MED ORDER — MELOXICAM 15 MG PO TABS: 15.0000 mg | ORAL_TABLET | Freq: Every day | ORAL | 0 refills | Status: DC

## 2021-09-15 MED ORDER — LIDOCAINE 5 % EX PTCH: 1.0000 | MEDICATED_PATCH | CUTANEOUS | 0 refills | Status: DC

## 2021-09-15 MED ORDER — NAPROXEN 250 MG PO TABS
500.0000 mg | ORAL_TABLET | ORAL | Status: AC
  Administered 2021-09-15: 500 mg via ORAL
  Filled 2021-09-15: qty 2

## 2021-09-15 MED ORDER — ACETAMINOPHEN 500 MG PO TABS
1000.0000 mg | ORAL_TABLET | Freq: Once | ORAL | Status: AC
  Administered 2021-09-15: 1000 mg via ORAL
  Filled 2021-09-15: qty 2

## 2021-09-15 NOTE — ED Notes (Signed)
Pt d/c home per MD order. Discharge summary reviewed with pt, pt verbalizes understanding. Ambulatory off unit. No s/s of acute distress noted at discharge.  °
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
## 2022-06-08 ENCOUNTER — Emergency Department (HOSPITAL_BASED_OUTPATIENT_CLINIC_OR_DEPARTMENT_OTHER)
Admission: EM | Admit: 2022-06-08 | Discharge: 2022-06-08 | Disposition: A | Discharge status: Home / Self Care | Payer: Medicaid Other | Attending: Emergency Medicine | Admitting: Emergency Medicine

## 2022-06-08 ENCOUNTER — Emergency Department (HOSPITAL_BASED_OUTPATIENT_CLINIC_OR_DEPARTMENT_OTHER): Payer: Medicaid Other

## 2022-06-08 ENCOUNTER — Other Ambulatory Visit (HOSPITAL_BASED_OUTPATIENT_CLINIC_OR_DEPARTMENT_OTHER): Payer: Self-pay

## 2022-06-08 ENCOUNTER — Other Ambulatory Visit: Payer: Self-pay

## 2022-06-08 DIAGNOSIS — D72829 Elevated white blood cell count, unspecified: Secondary | ICD-10-CM | POA: Diagnosis not present

## 2022-06-08 DIAGNOSIS — M79604 Pain in right leg: Secondary | ICD-10-CM | POA: Diagnosis present

## 2022-06-08 DIAGNOSIS — L03115 Cellulitis of right lower limb: Secondary | ICD-10-CM | POA: Diagnosis not present

## 2022-06-08 DIAGNOSIS — Q820 Hereditary lymphedema: Secondary | ICD-10-CM

## 2022-06-08 LAB — CBC WITH DIFFERENTIAL/PLATELET
Abs Immature Granulocytes: 0.1 10*3/uL — ABNORMAL HIGH (ref 0.00–0.07)
Basophils Absolute: 0 10*3/uL (ref 0.0–0.1)
Basophils Relative: 0 %
Eosinophils Absolute: 0 10*3/uL (ref 0.0–0.5)
Eosinophils Relative: 0 %
HCT: 42.6 % (ref 39.0–52.0)
Hemoglobin: 14.7 g/dL (ref 13.0–17.0)
Immature Granulocytes: 1 %
Lymphocytes Relative: 8 %
Lymphs Abs: 1.5 10*3/uL (ref 0.7–4.0)
MCH: 29.8 pg (ref 26.0–34.0)
MCHC: 34.5 g/dL (ref 30.0–36.0)
MCV: 86.4 fL (ref 80.0–100.0)
Monocytes Absolute: 0.7 10*3/uL (ref 0.1–1.0)
Monocytes Relative: 4 %
Neutro Abs: 17 10*3/uL — ABNORMAL HIGH (ref 1.7–7.7)
Neutrophils Relative %: 87 %
Platelets: 220 10*3/uL (ref 150–400)
RBC: 4.93 MIL/uL (ref 4.22–5.81)
RDW: 14.7 % (ref 11.5–15.5)
WBC: 19.3 10*3/uL — ABNORMAL HIGH (ref 4.0–10.5)
nRBC: 0 % (ref 0.0–0.2)

## 2022-06-08 LAB — BASIC METABOLIC PANEL
Anion gap: 9 (ref 5–15)
BUN: 11 mg/dL (ref 6–20)
CO2: 22 mmol/L (ref 22–32)
Calcium: 8.5 mg/dL — ABNORMAL LOW (ref 8.9–10.3)
Chloride: 100 mmol/L (ref 98–111)
Creatinine, Ser: 0.88 mg/dL (ref 0.61–1.24)
GFR, Estimated: >60 mL/min (ref >60)
Glucose, Bld: 116 mg/dL — ABNORMAL HIGH (ref 70–99)
Potassium: 3.2 mmol/L — ABNORMAL LOW (ref 3.5–5.1)
Sodium: 131 mmol/L — ABNORMAL LOW (ref 135–145)

## 2022-06-08 MED ORDER — KETOROLAC TROMETHAMINE 30 MG/ML IJ SOLN
30.0000 mg | Freq: Once | INTRAMUSCULAR | Status: AC
  Administered 2022-06-08: 30 mg via INTRAVENOUS
  Filled 2022-06-08: qty 1

## 2022-06-08 MED ORDER — HYDROCODONE-ACETAMINOPHEN 5-325 MG PO TABS
1.0000 | ORAL_TABLET | ORAL | 0 refills | Status: DC | PRN
  Filled 2022-06-08: qty 10, 2d supply, fill #0

## 2022-06-08 MED ORDER — POTASSIUM CHLORIDE CRYS ER 20 MEQ PO TBCR
40.0000 meq | EXTENDED_RELEASE_TABLET | Freq: Once | ORAL | Status: AC
  Administered 2022-06-08: 40 meq via ORAL
  Filled 2022-06-08: qty 2

## 2022-06-08 MED ORDER — CEPHALEXIN 250 MG PO CAPS
500.0000 mg | ORAL_CAPSULE | Freq: Once | ORAL | Status: AC
  Administered 2022-06-08: 500 mg via ORAL
  Filled 2022-06-08: qty 2

## 2022-06-08 MED ORDER — CEPHALEXIN 500 MG PO CAPS
500.0000 mg | ORAL_CAPSULE | Freq: Four times a day (QID) | ORAL | 0 refills | Status: DC
  Filled 2022-06-08: qty 28, 7d supply, fill #0

## 2022-06-08 NOTE — ED Triage Notes (Signed)
Patient presents to ED via POV from home. Here with right leg pain since yesterday. Reports "I have lymphedema and its flaring up". Ambulatory.

## 2022-06-08 NOTE — ED Provider Notes (Addendum)
[place] EMERGENCY DEPARTMENT AT [facility] Provider Note   CSN: [id number] Arrival date & time: 06/08/22  1058     History  Chief Complaint  Patient presents with   Leg Pain    Omar Hayden is a 37 y.o. male.  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  Pt presents to the ED today with right leg pain since yesterday.  Pt is not currently getting any treatment for his lymphedema.  He has no pcp.       Home Medications Prior to Admission medications   Medication Sig Start Date End Date Taking? Authorizing Provider  cephALEXin (KEFLEX) 500 MG capsule Take 1 capsule (500 mg total) by mouth 4 (four) times daily. 06/08/22  Yes Jacalyn Lefevre, MD  HYDROcodone-acetaminophen (NORCO/VICODIN) 5-325 MG tablet Take 1 tablet by mouth every 4 (four) hours as needed. 06/08/22  Yes Jacalyn Lefevre, MD  clotrimazole (LOTRIMIN) 1 % cream Apply topically 2 (two) times daily. Apply topically to affected toes BID 03/31/21   Glade Lloyd, MD  lidocaine (LIDODERM) 5 % Place 1 patch onto the skin daily. Remove & Discard patch within 12 hours or as directed by MD 09/15/21   Nicanor Alcon, April, MD  meloxicam (MOBIC) 15 MG tablet Take 1 tablet (15 mg total) by mouth daily. 09/15/21   Palumbo, April, MD  oxyCODONE-acetaminophen (PERCOCET/ROXICET) 5-325 MG tablet Take 1 tablet by mouth every 6 (six) hours as needed for severe pain. 05/06/21   Terrilee Files, MD      Allergies    Patient has no known allergies.    Review of Systems   Review of Systems  Musculoskeletal:        Leg pain  All other systems reviewed and are negative.   Physical Exam Updated Vital Signs BP (!) 147/95   Pulse (!) 102   Temp 97.6 F (36.4 C) (Oral)   Resp 17   Ht 6\' 2"  (1.88 m)   Wt 108.9 kg   SpO2 97%   BMI 30.81 kg/m  Physical Exam Vitals and nursing note reviewed.  Constitutional:      Appearance: Normal appearance.  HENT:      Head: Normocephalic and atraumatic.     Right Ear: External ear normal.     Left Ear: External ear normal.     Nose: Nose normal.     Mouth/Throat:     Mouth: Mucous membranes are moist.     Pharynx: Oropharynx is clear.  Eyes:     Extraocular Movements: Extraocular movements intact.     Conjunctiva/sclera: Conjunctivae normal.     Pupils: Pupils are equal, round, and reactive to light.  Cardiovascular:     Rate and Rhythm: Normal rate and regular rhythm.     Pulses: Normal pulses.     Heart sounds: Normal heart sounds.  Pulmonary:     Effort: Pulmonary effort is normal.     Breath sounds: Normal breath sounds.  Abdominal:     General: Abdomen is flat. Bowel sounds are normal.  Musculoskeletal:     Cervical back: Normal range of motion and neck supple.     Right lower leg: Edema present.     Left lower leg: Edema present.     Comments: BLE swelling.  Right slightly more swollen.  Pt has warmth to palpation on right.  Skin:    General: Skin is warm.     Capillary Refill: Capillary  refill takes less than 2 seconds.  Neurological:     Mental Status: He is alert and oriented to person, place, and time.  Psychiatric:        Mood and Affect: Mood normal.        Behavior: Behavior normal.     ED Results / Procedures / Treatments   Labs (all labs ordered are listed, but only abnormal results are displayed) Labs Reviewed  BASIC METABOLIC PANEL - Abnormal; Notable for the following components:      Result Value   Sodium 131 (*)    Potassium 3.2 (*)    Glucose, Bld 116 (*)    Calcium 8.5 (*)    All other components within normal limits  CBC WITH DIFFERENTIAL/PLATELET - Abnormal; Notable for the following components:   WBC 19.3 (*)    Neutro Abs 17.0 (*)    Abs Immature Granulocytes 0.10 (*)    All other components within normal limits    EKG None  Radiology US Venous Img Lower Bilateral (DVT)  Result Date: 06/08/2022 CLINICAL DATA:  Bilateral lymphedema.  Chronic but  worse today. EXAM: BILATERAL LOWER EXTREMITY VENOUS DOPPLER ULTRASOUND TECHNIQUE: Gray-scale sonography with graded compression, as well as color Doppler and duplex ultrasound were performed to evaluate the lower extremity deep venous systems from the level of the common femoral vein and including the common femoral, femoral, profunda femoral, popliteal and calf veins including the posterior tibial, peroneal and gastrocnemius veins when visible. The superficial great saphenous vein was also interrogated. Spectral Doppler was utilized to evaluate flow at rest and with distal augmentation maneuvers in the common femoral, femoral and popliteal veins. COMPARISON:  05/06/2021 FINDINGS: RIGHT LOWER EXTREMITY Common Femoral Vein: No evidence of thrombus. Normal compressibility, respiratory phasicity and response to augmentation. Saphenofemoral Junction: No evidence of thrombus. Normal compressibility and flow on color Doppler imaging. Profunda Femoral Vein: No evidence of thrombus. Normal compressibility and flow on color Doppler imaging. Femoral Vein: No evidence of thrombus. Normal compressibility, respiratory phasicity and response to augmentation. Popliteal Vein: No evidence of thrombus. Normal compressibility, respiratory phasicity and response to augmentation. Calf Veins: Limited evaluation. Superficial Great Saphenous Vein: No evidence of thrombus. Normal compressibility. Other Findings:  Prominent subcutaneous edema in the ankle. LEFT LOWER EXTREMITY Common Femoral Vein: No evidence of thrombus. Normal compressibility, respiratory phasicity and response to augmentation. Saphenofemoral Junction: No evidence of thrombus. Normal compressibility and flow on color Doppler imaging. Profunda Femoral Vein: No evidence of thrombus. Normal compressibility and flow on color Doppler imaging. Femoral Vein: No evidence of thrombus. Normal compressibility, respiratory phasicity and response to augmentation. Popliteal Vein: No  evidence of thrombus. Normal compressibility, respiratory phasicity and response to augmentation. Calf Veins: Limited evaluation. Superficial Great Saphenous Vein: No evidence of thrombus. Normal compressibility. Other Findings:  None. IMPRESSION: No evidence of deep venous thrombosis in either lower extremity. Limited evaluation of the calf veins. Electronically Signed   By: Richarda Overlie M.D.   On: 06/08/2022 12:36    Procedures Procedures    Medications Ordered in ED Medications  potassium chloride SA (KLOR-CON M) CR tablet 40 mEq (has no administration in time range)  cephALEXin (KEFLEX) capsule 500 mg (has no administration in time range)  ketorolac (TORADOL) 30 MG/ML injection 30 mg (30 mg Intravenous Given 06/08/22 1127)    ED Course/ Medical Decision Making/ A&P  Medical Decision Making Amount and/or Complexity of Data Reviewed Labs: ordered.  Risk Prescription drug management.   This patient presents to the ED for concern of swelling in leg, this involves an extensive number of treatment options, and is a complaint that carries with it a high risk of complications and morbidity.  The differential diagnosis includes dvt, cellulitis, lymphedema   Co morbidities that complicate the patient evaluation  Primary lymphedema   Additional history obtained:  Additional history obtained from epic chart review  Lab Tests:  I Ordered, and personally interpreted labs.  The pertinent results include:  cbc with wbc elevated at 19.3; bmp with k low at 3.2   Imaging Studies ordered:  I ordered imaging studies including [place]  I independently visualized and interpreted imaging which showed  No evidence of deep venous thrombosis in either lower extremity.  Limited evaluation of the calf veins.   I agree with the radiologist interpretation   Cardiac Monitoring:  The patient was maintained on a cardiac monitor.  I personally viewed and interpreted the  cardiac monitored which showed an underlying rhythm of: nsr   Medicines ordered and prescription drug management:  I ordered medication including keflex  for cellulitis  Reevaluation of the patient after these medicines showed that the patient improved I have reviewed the patients home medicines and have made adjustments as needed   Test Considered:  [place]   Critical Interventions:  [place]  Problem List / ED Course:  Primary lymphedema:  pt is not currently getting any treatment for this.  He is told to f/u with the lymphedema clinic at [facility].  He does not have a pcp and needs to establish care with his pcp. Cellulitis:  pt started on keflex. Hypokalemia:  pt given 1 dose of kdur in the ED.   Reevaluation:  After the interventions noted above, I reevaluated the patient and found that they have :improved   Social Determinants of Health:  Lives at home   Dispostion:  After consideration of the diagnostic results and the patients response to treatment, I feel that the patent would benefit from discharge with outpatient f/u.          Final Clinical Impression(s) / ED Diagnoses Final diagnoses:  Primary lymphedema  Cellulitis of right lower extremity    Rx / DC Orders ED Discharge Orders          Ordered    cephALEXin (KEFLEX) 500 MG capsule  4 times daily        06/08/22 1243    HYDROcodone-acetaminophen (NORCO/VICODIN) 5-325 MG tablet  Every 4 hours PRN        06/08/22 1243              Jacalyn Lefevre, MD 06/08/22 1245    Jacalyn Lefevre, MD 06/08/22 1246

## 2022-10-12 ENCOUNTER — Encounter: Payer: Self-pay | Admitting: Nurse Practitioner

## 2022-10-12 ENCOUNTER — Ambulatory Visit: Payer: Medicaid Other | Attending: Nurse Practitioner | Admitting: Nurse Practitioner

## 2022-10-12 VITALS — BP 138/84 | HR 95 | Ht 74.0 in | Wt 271.6 lb

## 2022-10-12 DIAGNOSIS — I890 Lymphedema, not elsewhere classified: Secondary | ICD-10-CM | POA: Diagnosis not present

## 2022-10-12 DIAGNOSIS — D72829 Elevated white blood cell count, unspecified: Secondary | ICD-10-CM

## 2022-10-12 DIAGNOSIS — Z7689 Persons encountering health services in other specified circumstances: Secondary | ICD-10-CM | POA: Diagnosis not present

## 2022-10-12 DIAGNOSIS — E871 Hypo-osmolality and hyponatremia: Secondary | ICD-10-CM

## 2022-10-12 NOTE — Progress Notes (Signed)
Assessment & Plan:  Omar Hayden was seen today for new patient (initial visit).  Diagnoses and all orders for this visit:  Hyponatremia -     CMP14+EGFR  Encounter to establish care  Leukocytosis, unspecified type -     CBC with Differential  Lymphedema of both lower extremities -     Ambulatory referral to Occupational Therapy -     Ambulatory referral to Physical Therapy    Patient has been counseled on age-appropriate routine health concerns for screening and prevention. These are reviewed and up-to-date. Referrals have been placed accordingly. Immunizations are up-to-date or declined.    Subjective:   Chief Complaint  Patient presents with   New Patient (Initial Visit)   HPI Omar Hayden 37 y.o. male presents to office today to establish care. He has a long standing history of BLE lymphedema and has never been evaluated or treated at a lymphedema clinic. States he has been dealing with chronic swelling and cellulitis since he was a child.   He denies any previous history of HTN, DM or Thyroid disorder.     Review of Systems  Constitutional:  Negative for fever, malaise/fatigue and weight loss.  HENT: Negative.  Negative for nosebleeds.   Eyes: Negative.  Negative for blurred vision, double vision and photophobia.  Respiratory: Negative.  Negative for cough and shortness of breath.   Cardiovascular:  Positive for leg swelling. Negative for chest pain and palpitations.  Gastrointestinal: Negative.  Negative for heartburn, nausea and vomiting.  Musculoskeletal: Negative.  Negative for myalgias.  Neurological: Negative.  Negative for dizziness, focal weakness, seizures and headaches.  Psychiatric/Behavioral: Negative.  Negative for suicidal ideas.     Past Medical History:  Diagnosis Date   Cellulitis    Hereditary lymphedema of legs    Lymphedema of both lower extremities     No past surgical history on file.  Family History  Problem Relation Age of Onset   Lung  cancer Father     Social History Reviewed with no changes to be made today.   Outpatient Medications Prior to Visit  Medication Sig Dispense Refill   cephALEXin (KEFLEX) 500 MG capsule Take 1 capsule (500 mg total) by mouth 4 (four) times daily. (Patient not taking: Reported on 10/12/2022) 28 capsule 0   clotrimazole (LOTRIMIN) 1 % cream Apply topically 2 (two) times daily. Apply topically to affected toes BID (Patient not taking: Reported on 10/12/2022) 30 g 1   HYDROcodone-acetaminophen (NORCO/VICODIN) 5-325 MG tablet Take 1 tablet by mouth every 4 (four) hours as needed. (Patient not taking: Reported on 10/12/2022) 10 tablet 0   lidocaine (LIDODERM) 5 % Place 1 patch onto the skin daily. Remove & Discard patch within 12 hours or as directed by MD (Patient not taking: Reported on 10/12/2022) 30 patch 0   meloxicam (MOBIC) 15 MG tablet Take 1 tablet (15 mg total) by mouth daily. (Patient not taking: Reported on 10/12/2022) 7 tablet 0   oxyCODONE-acetaminophen (PERCOCET/ROXICET) 5-325 MG tablet Take 1 tablet by mouth every 6 (six) hours as needed for severe pain. (Patient not taking: Reported on 10/12/2022) 10 tablet 0   No facility-administered medications prior to visit.    No Known Allergies     Objective:    BP 138/84 (BP Location: Right Arm, Patient Position: Sitting, Cuff Size: Normal)   Pulse 95   Ht 6\' 2"  (1.88 m)   Wt 271 lb 9.6 oz (123.2 kg)   SpO2 96%   BMI 34.87 kg/m  Wt  Readings from Last 3 Encounters:  10/12/22 271 lb 9.6 oz (123.2 kg)  06/08/22 240 lb (108.9 kg)  09/14/21 250 lb (113.4 kg)    Physical Exam Vitals and nursing note reviewed.  Constitutional:      Appearance: He is well-developed.  HENT:     Head: Normocephalic and atraumatic.  Cardiovascular:     Rate and Rhythm: Normal rate and regular rhythm.     Heart sounds: Normal heart sounds. No murmur heard.    No friction rub. No gallop.  Pulmonary:     Effort: Pulmonary effort is normal. No tachypnea or  respiratory distress.     Breath sounds: Normal breath sounds. No decreased breath sounds, wheezing, rhonchi or rales.  Chest:     Chest wall: No tenderness.  Abdominal:     General: Bowel sounds are normal.     Palpations: Abdomen is soft.  Musculoskeletal:        General: Normal range of motion.     Cervical back: Normal range of motion.     Right lower leg: Edema present.     Left lower leg: Edema present.     Right ankle: Swelling present. No deformity.     Left ankle: Swelling present.     Right foot: Swelling present.     Left foot: Swelling present.  Skin:    General: Skin is warm and dry.  Neurological:     Mental Status: He is alert and oriented to person, place, and time.     Coordination: Coordination normal.  Psychiatric:        Behavior: Behavior normal. Behavior is cooperative.        Thought Content: Thought content normal.        Judgment: Judgment normal.          Patient has been counseled extensively about nutrition and exercise as well as the importance of adherence with medications and regular follow-up. The patient was given clear instructions to go to ER or return to medical center if symptoms don't improve, worsen or new problems develop. The patient verbalized understanding.   Follow-up: Return in about 3 months (around 01/12/2023).   Claiborne Rigg, FNP-BC [facility] and [facility] [place], [place] [phone]   10/12/2022, 9:42 AM

## 2022-10-13 LAB — CMP14+EGFR
ALT: 17 [IU]/L (ref 0–44)
AST: 19 [IU]/L (ref 0–40)
Albumin: 4 g/dL — ABNORMAL LOW (ref 4.1–5.1)
Alkaline Phosphatase: 94 [IU]/L (ref 44–121)
BUN/Creatinine Ratio: 13 (ref 9–20)
BUN: 13 mg/dL (ref 6–20)
Bilirubin Total: 0.2 mg/dL (ref 0.0–1.2)
CO2: 23 mmol/L (ref 20–29)
Calcium: 9.5 mg/dL (ref 8.7–10.2)
Chloride: 103 mmol/L (ref 96–106)
Creatinine, Ser: 0.97 mg/dL (ref 0.76–1.27)
Globulin, Total: 3.7 g/dL (ref 1.5–4.5)
Glucose: 82 mg/dL (ref 70–99)
Potassium: 4.5 mmol/L (ref 3.5–5.2)
Sodium: 138 mmol/L (ref 134–144)
Total Protein: 7.7 g/dL (ref 6.0–8.5)
eGFR: 104 mL/min/{1.73_m2} (ref >59)

## 2022-10-13 LAB — CBC WITH DIFFERENTIAL/PLATELET
Basophils Absolute: 0.1 10*3/uL (ref 0.0–0.2)
Basos: 1 %
EOS (ABSOLUTE): 0.1 10*3/uL (ref 0.0–0.4)
Eos: 2 %
Hematocrit: 45.6 % (ref 37.5–51.0)
Hemoglobin: 14.4 g/dL (ref 13.0–17.7)
Immature Grans (Abs): 0 10*3/uL (ref 0.0–0.1)
Immature Granulocytes: 0 %
Lymphocytes Absolute: 2.5 10*3/uL (ref 0.7–3.1)
Lymphs: 45 %
MCH: 28.4 pg (ref 26.6–33.0)
MCHC: 31.6 g/dL (ref 31.5–35.7)
MCV: 90 fL (ref 79–97)
Monocytes Absolute: 0.5 10*3/uL (ref 0.1–0.9)
Monocytes: 9 %
Neutrophils Absolute: 2.5 10*3/uL (ref 1.4–7.0)
Neutrophils: 43 %
Platelets: 384 10*3/uL (ref 150–450)
RBC: 5.07 x10E6/uL (ref 4.14–5.80)
RDW: 14.3 % (ref 11.6–15.4)
WBC: 5.7 10*3/uL (ref 3.4–10.8)

## 2022-11-13 ENCOUNTER — Ambulatory Visit: Payer: Medicaid Other | Admitting: Internal Medicine

## 2023-01-12 ENCOUNTER — Ambulatory Visit: Payer: Medicaid Other | Admitting: Nurse Practitioner

## 2023-07-29 ENCOUNTER — Other Ambulatory Visit: Payer: Self-pay

## 2023-07-29 ENCOUNTER — Emergency Department (HOSPITAL_BASED_OUTPATIENT_CLINIC_OR_DEPARTMENT_OTHER)
Admission: EM | Admit: 2023-07-29 | Discharge: 2023-07-29 | Disposition: A | Discharge status: Home / Self Care | Attending: Emergency Medicine | Admitting: Emergency Medicine

## 2023-07-29 ENCOUNTER — Encounter (HOSPITAL_BASED_OUTPATIENT_CLINIC_OR_DEPARTMENT_OTHER): Payer: Self-pay

## 2023-07-29 DIAGNOSIS — M10072 Idiopathic gout, left ankle and foot: Secondary | ICD-10-CM | POA: Diagnosis not present

## 2023-07-29 DIAGNOSIS — M79675 Pain in left toe(s): Secondary | ICD-10-CM | POA: Diagnosis present

## 2023-07-29 MED ORDER — IBUPROFEN 800 MG PO TABS
800.0000 mg | ORAL_TABLET | Freq: Once | ORAL | Status: AC
  Administered 2023-07-29: 800 mg via ORAL
  Filled 2023-07-29: qty 1

## 2023-07-29 MED ORDER — PREDNISONE 10 MG PO TABS: ORAL_TABLET | ORAL | 0 refills | Status: AC

## 2023-07-29 MED ORDER — PREDNISONE 20 MG PO TABS
40.0000 mg | ORAL_TABLET | Freq: Once | ORAL | Status: AC
  Administered 2023-07-29: 40 mg via ORAL
  Filled 2023-07-29: qty 2

## 2023-07-29 MED ORDER — IBUPROFEN 800 MG PO TABS: 800.0000 mg | ORAL_TABLET | Freq: Three times a day (TID) | ORAL | 0 refills | Status: AC | PRN

## 2023-07-29 NOTE — ED Notes (Signed)
 Reviewed discharge instruction, follow up and medication with pt. Pt states understanding. Ambulatory at discharge

## 2023-07-29 NOTE — ED Provider Notes (Signed)
 [facility] AT [facility] [place] Provider Note   CSN: [id number] Arrival date & time: 07/29/23  1002     Patient presents with: Toe Pain   Omar Hayden is a 38 y.o. male with history of lymphedema, presents with concern for sudden onset of pain to his left great toe.  Denies any injury to the toe.  Reports it has been painful to put a sheet over his foot or wear his socks.  Denies any wounds to the toe or foot.  Denies any numbness or tingling in his toe.  States that gout runs in his family.    Toe Pain       Prior to Admission medications   Medication Sig Start Date End Date Taking? Authorizing Provider  ibuprofen  (ADVIL ) 800 MG tablet Take 1 tablet (800 mg total) by mouth every 8 (eight) hours as needed for moderate pain (pain score 4-6). 07/29/23  Yes Veta Palma, PA-C  predniSONE  (DELTASONE ) 10 MG tablet Take 4 tablets (40 mg total) by mouth daily with breakfast for 1 day, THEN 3 tablets (30 mg total) daily with breakfast for 2 days, THEN 2 tablets (20 mg total) daily with breakfast for 2 days, THEN 1 tablet (10 mg total) daily with breakfast for 1 day. 07/30/23 08/05/23 Yes Veta Palma, PA-C    Allergies: Patient has no known allergies.    Review of Systems  Skin:  Negative for wound.    Updated Vital Signs BP (!) 143/89 (BP Location: Left Arm)   Pulse 73   Temp 97.8 F (36.6 C) (Oral)   Resp 15   SpO2 98%   Physical Exam Vitals and nursing note reviewed.  Constitutional:      Appearance: Normal appearance.  HENT:     Head: Atraumatic.  Cardiovascular:     Comments: Cap refill less than 2 seconds in the left great toe Pulmonary:     Effort: Pulmonary effort is normal.  Musculoskeletal:     Comments: Left lower extremity:  General Patient with baseline lymphedema and has baseline swelling of the soft tissues diffusely of the lower extremities.  No erythema, contusions, open wounds   Palpation Tender directly over the left  great toe MCP joint.  Nontender of the distal phalanx of the left great toe.  Nontender of the first metatarsal.  Nontender elsewhere of the left foot.  ROM Full ankle flexion, extension, inversion and eversion. Able to wiggle all toes  Sensation: Sensation intact throughout the lower extremity   Neurological:     General: No focal deficit present.     Mental Status: He is alert.  Psychiatric:        Mood and Affect: Mood normal.        Behavior: Behavior normal.     (all labs ordered are listed, but only abnormal results are displayed) Labs Reviewed - No data to display  EKG: None  Radiology: No results found.   Procedures   Medications Ordered in the ED  ibuprofen  (ADVIL ) tablet 800 mg (800 mg Oral Given 07/29/23 1040)  predniSONE  (DELTASONE ) tablet 40 mg (40 mg Oral Given 07/29/23 1039)                                    Medical Decision Making Risk Prescription drug management.     Differential diagnosis includes but is not limited to fracture, dislocation, sprain, cellulitis, septic arthritis, gout  ED Course:  Upon initial evaluation, patient is well-appearing, no acute distress.  Stable vitals aside from elevated blood pressure 143/89.  Reporting sudden onset of pain to his left great toe.  He is tender to the left great toe MCP joint.  No bony tenderness to palpation.  Denies any known injury, low concern for fracture or dislocation.  No indication for x-ray imaging.  He does not have any overlying wounds, no erythema, low concern for cellulitis or septic arthritis. Neurovascularly intact in the LLE. Given location of pain and sudden onset, most concern for gout at this time.  Will treat for gout flare.  Reviewed patient's most recent previous labs from 06/08/22 which revealed normal creatinine and only mildly elevated glucose (116), will treat with ibuprofen  and prednisone .   Medications Given: 800mg  ibuprofen  40 mg prednisone    Impression: Gout of left  great toe  Disposition:  The patient was discharged home with instructions to take course of prednisone  as prescribed.  May take ibuprofen  as needed for pain.  Follow-up with PCP if he has recurrent episodes of gout for further management.  Was instructed on low purine diet. Return precautions given.    This chart was dictated using voice recognition software, Dragon. Despite the best efforts of this provider to proofread and correct errors, errors may still occur which can change documentation meaning.       Final diagnoses:  Acute idiopathic gout involving toe of left foot    ED Discharge Orders          Ordered    predniSONE  (DELTASONE ) 10 MG tablet  Q breakfast        07/29/23 1031    ibuprofen  (ADVIL ) 800 MG tablet  Every 8 hours PRN        07/29/23 1031               Veta Palma, PA-C 07/29/23 1100    Randol Simmonds, MD 07/30/23 [phone]

## 2023-07-29 NOTE — Discharge Instructions (Signed)
 You likely have a gout flare.  You have been started on ibuprofen  here in the ER for your gout. Please continue to take 800mg  of ibuprofen  every 8 hours as needed for pain. You may take your next dose at 5:30 tonight. Discontinue taking this medication when your pain has resolved.  You have been prescribed prednisone . Please take this medication as prescribed for the next 7 days (40mg  on days 1 and 2, 30mg  on days 3 and 4, 20mg  on days 5 and 6, 10mg  on day 7).  You were given your first dose here today.  You may take your next dose tomorrow morning.  Take this medication in the morning, as taking it at night may make it hard to sleep.   Maintaining a healthy weight and healthy blood pressure can help reduce the risk of flares. Limit consumption of alcohol, sweetened beverages (like sodas and sweet tea), red meats, organ meats (liver), and shellfish. Consumption of these foods can trigger flares.   If you have frequent flares, please schedule an appointment to talk to your PCP. They will be able to recommend further strategies for prevention of gout flare ups, which may include preventative medications.  Return to the ER if your symptoms do not start to improve within the next 48 hours, you develop fever or chills, any other new or concerning symptoms

## 2023-07-29 NOTE — ED Triage Notes (Signed)
 Pt reports left great toe. Reports hx of gout in family and wanted to be evaluated for the same. Denies any other symptoms.
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
